# Patient Record
Sex: Female | Born: 1935
Health system: Southern US, Community
[De-identification: ages and names within clinical notes are randomized; demographics above are authoritative.]

## PROBLEM LIST (undated history)

## (undated) DIAGNOSIS — N200 Calculus of kidney: Secondary | ICD-10-CM

## (undated) DIAGNOSIS — K219 Gastro-esophageal reflux disease without esophagitis: Secondary | ICD-10-CM

## (undated) DIAGNOSIS — Z973 Presence of spectacles and contact lenses: Secondary | ICD-10-CM

## (undated) DIAGNOSIS — R21 Rash and other nonspecific skin eruption: Secondary | ICD-10-CM

## (undated) DIAGNOSIS — I10 Essential (primary) hypertension: Secondary | ICD-10-CM

## (undated) DIAGNOSIS — N201 Calculus of ureter: Secondary | ICD-10-CM

## (undated) DIAGNOSIS — M1990 Unspecified osteoarthritis, unspecified site: Secondary | ICD-10-CM

## (undated) DIAGNOSIS — Z86718 Personal history of other venous thrombosis and embolism: Secondary | ICD-10-CM

## (undated) HISTORY — DX: Essential (primary) hypertension: I10

## (undated) HISTORY — PX: EXTRACORPOREAL SHOCK WAVE LITHOTRIPSY: SHX1557

---

## 1968-10-02 HISTORY — PX: TUBAL LIGATION: SHX77

## 1997-03-20 ENCOUNTER — Ambulatory Visit (HOSPITAL_COMMUNITY): Admission: RE | Admit: 1997-03-20 | Discharge: 1997-03-20 | Payer: Self-pay | Admitting: Obstetrics & Gynecology

## 1999-03-11 ENCOUNTER — Other Ambulatory Visit: Admission: RE | Admit: 1999-03-11 | Discharge: 1999-03-11 | Payer: Self-pay | Admitting: Obstetrics and Gynecology

## 2001-04-03 ENCOUNTER — Other Ambulatory Visit: Admission: RE | Admit: 2001-04-03 | Discharge: 2001-04-03 | Payer: Self-pay | Admitting: Obstetrics and Gynecology

## 2002-01-18 ENCOUNTER — Encounter: Payer: Self-pay | Admitting: Orthopedic Surgery

## 2002-01-19 ENCOUNTER — Ambulatory Visit (HOSPITAL_COMMUNITY): Admission: RE | Admit: 2002-01-19 | Discharge: 2002-01-19 | Payer: Self-pay | Admitting: Orthopedic Surgery

## 2002-01-19 HISTORY — PX: OTHER SURGICAL HISTORY: SHX169

## 2002-02-13 ENCOUNTER — Emergency Department (HOSPITAL_COMMUNITY): Admission: EM | Admit: 2002-02-13 | Discharge: 2002-02-13 | Payer: Self-pay | Admitting: Emergency Medicine

## 2002-02-13 ENCOUNTER — Inpatient Hospital Stay (HOSPITAL_COMMUNITY): Admission: AD | Admit: 2002-02-13 | Discharge: 2002-02-19 | Payer: Self-pay | Admitting: *Deleted

## 2002-02-14 ENCOUNTER — Encounter: Payer: Self-pay | Admitting: *Deleted

## 2002-03-21 ENCOUNTER — Encounter: Admission: RE | Admit: 2002-03-21 | Discharge: 2002-05-15 | Payer: Self-pay | Admitting: Orthopedic Surgery

## 2005-04-05 ENCOUNTER — Other Ambulatory Visit: Admission: RE | Admit: 2005-04-05 | Discharge: 2005-04-05 | Payer: Self-pay | Admitting: Obstetrics and Gynecology

## 2006-10-12 ENCOUNTER — Ambulatory Visit (HOSPITAL_COMMUNITY): Admission: RE | Admit: 2006-10-12 | Discharge: 2006-10-12 | Payer: Self-pay | Admitting: Ophthalmology

## 2008-05-16 ENCOUNTER — Other Ambulatory Visit: Admission: RE | Admit: 2008-05-16 | Discharge: 2008-05-16 | Payer: Self-pay | Admitting: Obstetrics and Gynecology

## 2009-05-22 ENCOUNTER — Encounter: Admission: RE | Admit: 2009-05-22 | Discharge: 2009-05-22 | Payer: Self-pay | Admitting: Internal Medicine

## 2009-06-17 ENCOUNTER — Encounter
Admission: RE | Admit: 2009-06-17 | Discharge: 2009-09-09 | Payer: Self-pay | Admitting: Physical Medicine & Rehabilitation

## 2009-06-18 ENCOUNTER — Ambulatory Visit: Payer: Self-pay | Admitting: Physical Medicine & Rehabilitation

## 2009-07-25 ENCOUNTER — Ambulatory Visit: Payer: Self-pay | Admitting: Physical Medicine & Rehabilitation

## 2009-09-09 ENCOUNTER — Encounter
Admission: RE | Admit: 2009-09-09 | Discharge: 2009-09-17 | Payer: Self-pay | Admitting: Physical Medicine & Rehabilitation

## 2009-09-17 ENCOUNTER — Ambulatory Visit: Payer: Self-pay | Admitting: Physical Medicine & Rehabilitation

## 2009-10-24 ENCOUNTER — Encounter
Admission: RE | Admit: 2009-10-24 | Discharge: 2009-10-30 | Payer: Self-pay | Source: Home / Self Care | Attending: Physical Medicine & Rehabilitation | Admitting: Physical Medicine & Rehabilitation

## 2009-10-30 ENCOUNTER — Ambulatory Visit: Payer: Self-pay | Admitting: Physical Medicine & Rehabilitation

## 2009-12-22 ENCOUNTER — Encounter
Admission: RE | Admit: 2009-12-22 | Discharge: 2010-03-03 | Payer: Self-pay | Source: Home / Self Care | Attending: Physical Medicine & Rehabilitation | Admitting: Physical Medicine & Rehabilitation

## 2009-12-30 ENCOUNTER — Ambulatory Visit: Payer: Self-pay | Admitting: Physical Medicine & Rehabilitation

## 2010-03-24 ENCOUNTER — Ambulatory Visit (HOSPITAL_BASED_OUTPATIENT_CLINIC_OR_DEPARTMENT_OTHER): Payer: Medicare Other | Admitting: Physical Medicine & Rehabilitation

## 2010-03-24 ENCOUNTER — Encounter: Payer: Medicare Other | Attending: Physical Medicine & Rehabilitation

## 2010-03-24 DIAGNOSIS — M5380 Other specified dorsopathies, site unspecified: Secondary | ICD-10-CM

## 2010-03-24 DIAGNOSIS — G8929 Other chronic pain: Secondary | ICD-10-CM | POA: Insufficient documentation

## 2010-03-24 DIAGNOSIS — E669 Obesity, unspecified: Secondary | ICD-10-CM | POA: Insufficient documentation

## 2010-03-24 DIAGNOSIS — I10 Essential (primary) hypertension: Secondary | ICD-10-CM

## 2010-03-24 DIAGNOSIS — M47817 Spondylosis without myelopathy or radiculopathy, lumbosacral region: Secondary | ICD-10-CM

## 2010-03-24 DIAGNOSIS — IMO0002 Reserved for concepts with insufficient information to code with codable children: Secondary | ICD-10-CM

## 2010-03-24 DIAGNOSIS — M129 Arthropathy, unspecified: Secondary | ICD-10-CM | POA: Insufficient documentation

## 2010-03-24 DIAGNOSIS — M519 Unspecified thoracic, thoracolumbar and lumbosacral intervertebral disc disorder: Secondary | ICD-10-CM | POA: Insufficient documentation

## 2010-04-28 ENCOUNTER — Other Ambulatory Visit: Payer: Self-pay | Admitting: Obstetrics and Gynecology

## 2010-04-28 ENCOUNTER — Other Ambulatory Visit (HOSPITAL_COMMUNITY)
Admission: RE | Admit: 2010-04-28 | Discharge: 2010-04-28 | Disposition: A | Discharge status: Home / Self Care | Payer: Medicare Other | Source: Ambulatory Visit | Attending: Obstetrics and Gynecology | Admitting: Obstetrics and Gynecology

## 2010-04-28 DIAGNOSIS — Z01419 Encounter for gynecological examination (general) (routine) without abnormal findings: Secondary | ICD-10-CM | POA: Insufficient documentation

## 2010-06-19 NOTE — H&P (Signed)
NAME:  Tasha, Tran NO.:  [id number]   MEDICAL RECORD NO.:  [id number]                   PATIENT TYPE:  INP   LOCATION:  A327                                 FACILITY:  [facility]   PHYSICIAN:  Sarita Bottom, M.D.                  DATE OF BIRTH:  [birth year]   DATE OF ADMISSION:  02/13/2002  DATE OF DISCHARGE:                                HISTORY & PHYSICAL   HISTORY OF PRESENT ILLNESS:  [name] is a 77 year old lady with  a history of hypertension.  She recently had a fracture of the left ankle,  and she is status post open reduction and internal fixation on 01/19/02.  She was apparently well until yesterday when she noticed some tenderness in  her right calf and also a bruise.  She told her primary doctor, who  recommended that she have an ultrasound done of the lower extremities, the  result of which were positive for deep vein thrombosis.  She was therefore  advised to come to the emergency room for admission.   REVIEW OF SYSTEMS:  She denies any fever or weight loss.  Denies any  shortness of breath or cough.  Denies any chest pain or palpitations.  Denies any headache or dizziness.  All other systems were reviewed and were  negative.   PAST MEDICAL HISTORY:  1. She has hypertension.  2. She is status post open reduction and internal fixation of a left ankle     fracture.  3. She had a tubal ligation 30 years ago.   MEDICATIONS:  1. Micardis 40 mg daily.  2. Hydrochlorothiazide 12.5 mg daily.   ALLERGIES:  She has no known drug allergies.   FAMILY HISTORY:  Noncontributory.   SOCIAL HISTORY:  She is married with four children.  She does not smoke.  She drinks alcohol occasionally.   PHYSICAL EXAMINATION:  VITAL SIGNS:  Blood pressure is 160/110, heart rate  90, temperature 99.0.  GENERAL:  She is an elderly, pleasant-looking lady.  She is not in any  apparent distress.  HEENT:  She is not pale.  She is anicteric.  Pupils  equal, round and  reactive to light and accommodation.  Oral mucosa is moist.  NECK:  Supple.  There is no thyromegaly.  There is no lymphadenopathy.  CHEST:  Clear to auscultation.  CARDIOVASCULAR:  Heart sounds 1 and 2 are normal.  Rhythm is regular.  Rate  is normal.  No murmur is appreciated.  ABDOMEN:  Soft.  Bowel sounds are present.  No masses or organomegaly were  felt.  CNS:  She is alert and oriented x3.  She has no gross or focal deficits.  EXTREMITIES:  Her left leg is in a cast.  On her right leg she has some  slight tenderness in the right calf.  She has a palpable purpura on the  calf.  LABORATORY DATA:  Positive D-dimer of 1.8.  Her ABG on room air shows a pH  of 7.42, PCO2 36.7, PO2 91.9, bicarb 23.5, oxygen saturation of 97.5.  PTT  is 29, INR of 1.0.  Sodium is 142, potassium is 3.2, chloride 106, CO2 27,  BUN 16, creatinine 0.8, glucose 109.  Calcium is 9.9.  WBCs 6.8, hemoglobin  12.0, MCV 93.9, platelet 199.   ASSESSMENT AND PLAN:  1. Deep vein thrombosis of the lower extremities.  The patient will be     started on Lovenox 1 mg/kg.  We started her on Coumadin 5 mg tomorrow,     and given her Vicodin p.r.n. for pain.  Will try to get a Doppler     ultrasound report from the primarily M.D.  2. Hypertension.  The patient will be resumed on her Micardis and     hydrochlorothiazide at regular doses.  3. Left ankle fracture.  Will do an x-ray of the left ankle joint, and she     may need orthopedic consult evaluation for possible removal of the cast.  4. Slight hyperkalemia.  This will be corrected with p.o. K-Dur.  We will     monitor her BMET and follow her potassium level.   Further workup will depend on patient's clinical course.                                               Sarita Bottom, M.D.    DW/MEDQ  D:  02/13/2002  T:  02/13/2002  Job:  [id number]

## 2010-06-19 NOTE — Discharge Summary (Signed)
NAMESHARRONDA, SCHWEERS                         ACCOUNT NO.:  [id number]   MEDICAL RECORD NO.:  [id number]                   PATIENT TYPE:  INP   LOCATION:  A327                                 FACILITY:  [facility]   PHYSICIAN:  Sarita Bottom, M.D.                  DATE OF BIRTH:  [birth year]   DATE OF ADMISSION:  02/13/2002  DATE OF DISCHARGE:  02/19/2002                                 DISCHARGE SUMMARY   DISCHARGE DIAGNOSES:  1. Deep venous thrombosis of the lower extremities.  2. Hypertension.  3. Left ankle fracture status post open reduction internal fixation.   DISCHARGE MEDICATIONS:  1. Coumadin 5 mg p.o. daily.  2. Hydrochlorothiazide 25 daily.  3. Norvasc 5 mg daily.  4. Avapro 150 mg daily.  5. Ibuprofen 400 mg t.i.d. p.r.n.   DISPOSITION:  The patient to be discharged home.   FOLLOW UP:  Follow up with primary M.D.  Monitor patient's PT and INR and  also orthopedic follow-up for fracture of the left ankle joint.   HISTORY OF PRESENT ILLNESS:  The patient is a 77 year old lady with a  history of hypertension, history of recent fracture of the left ankle joint  indicated on the 13th of this month when she presented with a complaint of  DVT of both lower extremities.  The patient had seen primary M.D. earlier on  the day of admission with a complaint of tenderness in the legs.  An  ultrasound that was done was suggestive of a DVT of the lower extremities  bilaterally.   PHYSICAL EXAMINATION:  VITAL SIGNS:  Blood pressure 160/110.  CHEST:  Clear to auscultation.  EXTREMITIES:  Her left leg was in a cast.  Right leg:  There was tenderness  in the calf region.   ADMISSION LABORATORIES:  __________ , blood gas on room air showed a pCO2 of  56.7, pO2 of 91.9.  WBC was 6.8, hemoglobin of 12.   HOSPITAL COURSE:  She was admitted with an impression of bilateral DVT,  hypertension, and left ankle fracture.  She was started on Lovenox 1 mg/kg  and Coumadin was started the day  after.  For hypertension she was resumed on  her antihypertensive medication.  For the left ankle joint repeat x-ray was  done.  Her clinical course was essentially unremarkable.  The patient, as  mentioned on rounds today, she has no new complaints.  Denies any shortness  of breath.  Denies any chest pain.   PHYSICAL EXAMINATION:  CHEST:  Clear to auscultation.  HEART:  Sounds 1 and 2 are normal.  Rhythm is regular.  Rate is normal.  ABDOMEN:  Benign.  CNS:  She is alert and oriented x2.  She has no gross or focal deficits.   LABORATORIES:  Her latest laboratories shows hemoglobin of 13, hematocrit of  37.8.  Her INR is 2.0.   DISPOSITION:  Discharged home.  Follow up with primary M.D.  Follow-ups  include to check PT and INR and orthopedic followup.   CONDITION ON DISCHARGE:  Stable and satisfactory.                                               Sarita Bottom, M.D.    DW/MEDQ  D:  02/19/2002  T:  02/19/2002  Job:  [id number]

## 2010-06-19 NOTE — Op Note (Signed)
   NAME:  Tasha Tran, Tasha Tran NO.:  [id number]   MEDICAL RECORD NO.:  [id number]                   PATIENT TYPE:  OIB   LOCATION:  2899                                 FACILITY:  [facility]   PHYSICIAN:  Thera Flake., M.D.             DATE OF BIRTH:  [birth year]   DATE OF PROCEDURE:  01/19/2002  DATE OF DISCHARGE:                                 OPERATIVE REPORT   PREOPERATIVE DIAGNOSIS:  Displaced lateral malleolus fracture with  disruption of the medial deltoid and syndesmosis.   POSTOPERATIVE DIAGNOSIS:  Displaced lateral malleolus fracture with  disruption of the medial deltoid and syndesmosis.   PROCEDURES:  1. Open reduction and internal fixation of lateral malleolus with a seven-     hole semitubular plate.  2. Open reduction and internal fixation of syndesmosis (one 4.5 mm screw).   SURGEON:  Dyke Brackett, M.D.   ANESTHESIA:  General.   TOURNIQUET TIME:  44 minutes.   DESCRIPTION OF PROCEDURE:  Sterile prep and drape.  Exsanguination,  inflation of the tourniquet to 375.  Straight skin incision made on the  lateral side of the ankle.  Lateral malleolus exposed.  An oblique fracture  of the fibula identified about 3-4 cm above the mortise joint.  The anterior  tib-fib ligament was avulsed off the fibula, was reapproximated, followed by  provisional fixation with a seven-hole semitubular plate bent for the  contour of the normal ankle.  It was placed on the posterior lateral aspect  of the fibula, anatomic reduction obtained.  All screws were used, including  the middle screw for a 4.5 mm syndesmotic screw, which was inserted with the  4.5 mm screw.  Anatomic reduction was confirmed in the AP, lateral, and  mortise views.  Copious irrigation carried out.  The medial joint space was  well-reduced.  Closure was effected with 0, 2-0, and 3-0 Vicryl and skin  clips.  Marcaine without epinephrine infiltrated in the skin and the joint,  a total of  20 cc 0.5%.  A lightly compressive sterile dressing applied with  a plaster splint with the foot in neutral.  Taken to the recovery room in  stable condition.                                               Thera Flake., M.D.    WDC/MEDQ  D:  01/19/2002  T:  01/20/2002  Job:  [id number]

## 2010-08-18 ENCOUNTER — Ambulatory Visit: Payer: Medicare Other | Admitting: Physical Medicine & Rehabilitation

## 2010-08-25 ENCOUNTER — Encounter: Payer: Medicare Other | Attending: Physical Medicine & Rehabilitation | Admitting: Physical Medicine & Rehabilitation

## 2010-08-25 DIAGNOSIS — IMO0002 Reserved for concepts with insufficient information to code with codable children: Secondary | ICD-10-CM

## 2010-08-25 DIAGNOSIS — E669 Obesity, unspecified: Secondary | ICD-10-CM | POA: Insufficient documentation

## 2010-08-25 DIAGNOSIS — M519 Unspecified thoracic, thoracolumbar and lumbosacral intervertebral disc disorder: Secondary | ICD-10-CM | POA: Insufficient documentation

## 2010-08-25 DIAGNOSIS — M47817 Spondylosis without myelopathy or radiculopathy, lumbosacral region: Secondary | ICD-10-CM

## 2010-08-25 DIAGNOSIS — I10 Essential (primary) hypertension: Secondary | ICD-10-CM | POA: Insufficient documentation

## 2010-08-25 DIAGNOSIS — M129 Arthropathy, unspecified: Secondary | ICD-10-CM | POA: Insufficient documentation

## 2010-08-25 DIAGNOSIS — M545 Low back pain, unspecified: Secondary | ICD-10-CM | POA: Insufficient documentation

## 2010-08-25 DIAGNOSIS — M5380 Other specified dorsopathies, site unspecified: Secondary | ICD-10-CM

## 2010-08-25 NOTE — Assessment & Plan Note (Signed)
Tasha Tran is back regarding her back pain.  For the most part, she is generally doing well.  She reports some pain in the low back when she is bending to do some tasks around the house.  The back tends to bother at days and during night.  Her leg pain has gone.  Her pain is around 4- 6/10.  She uses usually 1 or 2 ibuprofen in the morning and perhaps 1 or 2 naproxen weekly pain control.  She is doing water aerobics 3 times a week and rather enjoys this.  Pain seems to be worse at night and when she wakes up in the morning.  REVIEW OF SYSTEMS:  Notable for occasional constipation or urinary retention.  Full 12-point review is in the written health and history section of the chart.  SOCIAL HISTORY:  Unchanged.  PHYSICAL EXAMINATION:  VITAL SIGNS:  Blood pressure is 108/70, pulse 71, respiratory rate 18 and she is satting 94% on room air. GENERAL:  The patient is pleasant and alert.  She has general pain in the low lumbar spine and PSIS area.  Strength is stable.  She remains overweight. NEUROLOGIC:  Cognitively, she is alert and appropriate. HEART:  Regular. CHEST:  Clear. ABDOMEN:  Soft and nontender.  ASSESSMENT: 1. Chronic low back pain related to lumbar disk disease and facet     arthropathy.  Radiculopathy symptoms have resolved. 2. Obesity. 3. Hypertension and aerobic deconditioning.  PLAN: 1. Recommended increasing aerobic stamina either through biking and or     walking.  Stationary bike would be good for her to work on posture     and positioning. 2. We also discussed Pilates exercises.  I did give her a packet     today.  We do have a Pilates treatment plan through [facility]     outpatient that could be of benefit for her.  She will call me back     if she would like to pursue this. 3. Continue her anti-inflammatories per her schedule.  I see no need     to make a change here today. 4. I will follow up with her on as needed basis.     Ranelle Oyster,  M.D. Electronically Signed    ZTS/MedQ D:  08/25/2010 10:34:31  T:  08/25/2010 12:09:57  Job #:  [id number]  cc:   Hal T. Stoneking, M.D. Fax: [phone]

## 2011-03-03 ENCOUNTER — Encounter: Payer: Medicare Other | Attending: Neurosurgery | Admitting: Neurosurgery

## 2011-03-03 DIAGNOSIS — G894 Chronic pain syndrome: Secondary | ICD-10-CM

## 2011-03-03 DIAGNOSIS — M545 Low back pain, unspecified: Secondary | ICD-10-CM | POA: Insufficient documentation

## 2011-03-03 DIAGNOSIS — M5430 Sciatica, unspecified side: Secondary | ICD-10-CM | POA: Insufficient documentation

## 2011-03-03 NOTE — Assessment & Plan Note (Signed)
This is a patient of Dr. Rosalyn Charters seen on a p.r.n. basis for low back pain.  She states she had been doing better since she got an epidural steroid injection about a year ago, but it was at some kind of a treat a week or two ago and felt some pain in her low back which has worsened and it has run down the posterolateral aspect of her right leg.  She rates her pain at a 10.  It is a sharp, dull, aching pain.  General activity level is 0-9.  Pain is same 24 hours a day.  Sleep patterns are poor.  Walking, sitting, and standing aggravate.  Heat and medication help.  She uses a walker to ambulate.  She does not climb steps or drive.  She is retired.  REVIEW OF SYSTEMS:  Notable for difficulties as described above as well as some urinary retention.  PAST MEDICAL HISTORY:  Unchanged.  SOCIAL HISTORY:  Unchanged.  FAMILY HISTORY:  Unchanged.  PHYSICAL EXAMINATION:  Blood pressure is 102/58, pulse 87, respirations 16, O2 sats 96 on room air.  Her motor strength and sensation intact, although she gives way to pain on the right lower extremity. Constitutionally, she is obese.  She is alert and oriented x3.  She has a limp to her gait.  ASSESSMENT: 1. Questionable herniated nucleus pulposus. 2. Chronic low back pain and sciatica.  PLAN:  Start her on Sterapred 10 mg 6-day Dosepak, take as directed and we are going to obtain an MRI without gadolinium of the lumbar spine and bring her back to see Dr. Riley Kill after that to make treatment and recommendations.  She is in agreement with this.  Her questions were encouraged and answered.     Milford Cilento L. Blima Dessert Electronically Signed    RLW/MedQ D:  03/03/2011 11:08:08  T:  03/03/2011 13:09:27  Job #:  [id number]

## 2011-03-10 ENCOUNTER — Other Ambulatory Visit: Payer: Self-pay | Admitting: Physical Medicine & Rehabilitation

## 2011-03-10 DIAGNOSIS — M545 Low back pain: Secondary | ICD-10-CM

## 2011-03-17 ENCOUNTER — Ambulatory Visit
Admission: RE | Admit: 2011-03-17 | Discharge: 2011-03-17 | Disposition: A | Discharge status: Home / Self Care | Payer: Medicare Other | Source: Ambulatory Visit | Attending: Physical Medicine & Rehabilitation | Admitting: Physical Medicine & Rehabilitation

## 2011-03-17 DIAGNOSIS — M545 Low back pain: Secondary | ICD-10-CM

## 2011-04-01 ENCOUNTER — Ambulatory Visit (HOSPITAL_BASED_OUTPATIENT_CLINIC_OR_DEPARTMENT_OTHER): Payer: Medicare Other | Admitting: Physical Medicine & Rehabilitation

## 2011-04-01 ENCOUNTER — Encounter: Payer: Self-pay | Admitting: Physical Medicine & Rehabilitation

## 2011-04-01 ENCOUNTER — Encounter: Payer: Medicare Other | Attending: Neurosurgery

## 2011-04-01 DIAGNOSIS — IMO0002 Reserved for concepts with insufficient information to code with codable children: Secondary | ICD-10-CM

## 2011-04-01 DIAGNOSIS — M5430 Sciatica, unspecified side: Secondary | ICD-10-CM | POA: Insufficient documentation

## 2011-04-01 DIAGNOSIS — M545 Low back pain, unspecified: Secondary | ICD-10-CM | POA: Insufficient documentation

## 2011-04-01 NOTE — Patient Instructions (Signed)
Epidural Steroid Injection  Care After   Refer to this sheet in the next few weeks. These instructions provide you with information on caring for yourself after your procedure. Your caregiver may also give you more specific instructions. Your treatment has been planned according to current medical practices, but problems sometimes occur. Call your caregiver if you have any problems or questions after your procedure.  HOME CARE INSTRUCTIONS   · Avoid the use of heat on the injection site for a day.   · Do not have a tub bath or soak in water for the rest of the day.   · Remove the bandage on the next day.   · Resume your normal activities on the next day.   · Use ice packs or mild pain relievers to reduce the soreness around the injection site.   · Follow up with your caregiver 7 to 10 days after the procedure.   SEEK MEDICAL CARE IF:   · You develop a fever of more than 100.5° F (38.1° C).   · You continue to have pain and soreness over the injection site even after taking medicines.   · You develop significant nausea or vomiting.   SEEK IMMEDIATE MEDICAL CARE IF:   · You have severe back pain, which is not relieved by medicines.   · You develop severe headache, stiff neck, or sensitivity to light.   · You develop any new numbness or weakness of your legs.   · You lose control over your bladder or bowel movements.   · You develop a fever of more than 102° F (38.9° C).   · You develop difficulty breathing.   Document Released: 05/05/2010 Document Revised: 09/30/2010 Document Reviewed: 05/05/2010  [employer]® Patient Information ©2012 [employer].

## 2011-04-01 NOTE — Progress Notes (Signed)
Lumbar transforaminal epidural steroid injection under fluoroscopic guidance right L4-L5 Indication: Lumbosacral radiculitis is not relieved by medication management or other conservative care and interfering with self-care and mobility.   Informed consent was obtained after describing risk and benefits of the procedure with the patient, this includes bleeding, bruising, infection, paralysis and medication side effects.  The patient wishes to proceed and has given written consent.  Patient was placed in prone position.  The lumbar area was marked and prepped with Betadine.  It was entered with a 25-gauge 1-1/2 inch needle and one mL of 1% lidocaine was injected into the skin and subcutaneous tissue.  Then a 22-gauge 5 inch spinal needle was inserted into the L4-L5 right side intervertebral foramen under AP, lateral, and oblique view.  Then a solution containing one mL of 10 mg per mL dexamethasone and 2 mL of 1% lidocaine was injected.  The patient tolerated procedure well.  Post procedure instructions were given.  Please see post procedure form.

## 2011-04-01 NOTE — Progress Notes (Signed)
  PROCEDURE RECORD [facility]   Name: Tasha Tran DOB:09/08/1929 MRN: [id number]  Date:04/01/2011  Physician: Claudette Laws, MD    Nurse/CMA: Noralyn Pick, CMA  Allergies: No Known Allergies  Consent Signed: yes  Is patient diabetic? no   Pregnant: no LMP: No LMP recorded. Patient is not currently having periods (Reason: Other). (age [age])  Anticoagulants: no Anti-inflammatory: yes Antibiotics: no  Procedure: LESI  Position: Prone Start Time: 11:55am  End Time: 12:01pm  Fluoro Time: 13 secs  RN/CMA Noralyn Pick, CMA Carroll, CMA    Time 11:34am 12:05pm    BP 144/69 128/69    Pulse 83 82    Respirations 16 16    O2 Sat 96 96    S/S 6 6    Pain Level 8-9/10 6/10     D/C home with husband, patient A & O X 3, D/C instructions reviewed, and sits independently.

## 2011-04-02 ENCOUNTER — Encounter: Payer: Medicare Other | Admitting: Physical Medicine & Rehabilitation

## 2011-04-05 ENCOUNTER — Ambulatory Visit: Payer: Medicare Other | Admitting: Physical Medicine & Rehabilitation

## 2011-04-14 ENCOUNTER — Encounter: Payer: Self-pay | Admitting: Physical Medicine & Rehabilitation

## 2011-04-14 ENCOUNTER — Encounter: Payer: Medicare Other | Attending: Neurosurgery | Admitting: Physical Medicine & Rehabilitation

## 2011-04-14 VITALS — BP 106/59 | HR 77 | Resp 16 | Ht 60.0 in | Wt 210.0 lb

## 2011-04-14 DIAGNOSIS — M48061 Spinal stenosis, lumbar region without neurogenic claudication: Secondary | ICD-10-CM | POA: Insufficient documentation

## 2011-04-14 DIAGNOSIS — IMO0002 Reserved for concepts with insufficient information to code with codable children: Secondary | ICD-10-CM | POA: Insufficient documentation

## 2011-04-14 MED ORDER — TRAMADOL HCL 50 MG PO TABS: 50.0000 mg | ORAL_TABLET | Freq: Two times a day (BID) | ORAL | Status: AC | PRN

## 2011-04-14 MED ORDER — CARBAMAZEPINE ER 100 MG PO TB12: 100.0000 mg | ORAL_TABLET | Freq: Two times a day (BID) | ORAL | Status: AC

## 2011-04-14 NOTE — Progress Notes (Signed)
Subjective:    Patient ID: Tasha Tran, female    DOB: 1929-07-18, 78 y.o.   MRN: [id number]  HPI  Mrs. Disch is back regarding her back and low back pain. It resurfaced about 7 weeks ago. I reimaged her lumbar spine last month and it revealed:   L4-5: Prominent bilateral facet joint degenerative changes. 7 mm  anterior slip of L4. Moderate bulge/broad-based protrusion  slightly greater to the right. Ligamentum flavum hypertrophy  greater on the left. Dorsal epidural fat. Multifactorial moderate  to marked spinal stenosis. Mild bilateral foraminal narrowing.  L5-S1: Moderate to marked bilateral facet joint degenerative  changes. Bony overgrowth greater on the left. 3.6 mm anterior slip  L5. Bulge. Mild left lateral recess stenosis  The patient had a follow up ESI but a L4-L5 approach was used because of her central stenosis I believe.  The patient states that she didn't have any relief from the injection.  Pain is generally down the lateral leg into the ankle.  The pain is worst at night into the morning. Activity also increases pain.  The neurontin makes her feel loopy and the mobic wasn't helping.   Pain Inventory Average Pain 9 Pain Right Now 9 My pain is sharp, dull and aching  In the last 24 hours, has pain interfered with the following? General activity 6 Relation with others 1 Enjoyment of life 2 What TIME of day is your pain at its worst? night Sleep (in general) Fair  Pain is worse with: It's hard to pin point Pain improves with: medication Relief from Meds: 3  Mobility walk without assistance  Function retired  Neuro/Psych No problems in this area  Prior Studies Any changes since last visit?  no  Physicians involved in your care Any changes since last visit?  no      Review of Systems  Constitutional: Negative.   HENT: Negative.   Eyes: Negative.   Respiratory: Negative.   Cardiovascular: Negative.   Gastrointestinal: Negative.     Genitourinary: Negative.   Musculoskeletal: Negative.   Skin: Negative.   Neurological: Negative.   Hematological: Negative.   Psychiatric/Behavioral: Negative.        Objective:   Physical Exam  Constitutional: She is oriented to person, place, and time. She appears well-developed and well-nourished.       obese  HENT:  Head: Normocephalic.  Eyes: EOM are normal. Pupils are equal, round, and reactive to light.  Neck: Normal range of motion.  Cardiovascular: Normal rate and regular rhythm.   Pulmonary/Chest: Effort normal and breath sounds normal.  Abdominal: Soft. Bowel sounds are normal.  Musculoskeletal: Normal range of motion.       Right low back pain with palpation.  Mild pain with lubmbar flexion.  Can bend to about 80 degrees. SLR and seated slump test is equivocal.  Neurological: She is alert and oriented to person, place, and time. She has normal strength and normal reflexes. A sensory deficit is present.  Skin: Skin is warm.  Psychiatric: She has a normal mood and affect. Her behavior is normal. Judgment and thought content normal.          Assessment & Plan:  ASSESSMENT:  1. Chronic low back pain related to lumbar disk disease and facet  arthropathy. Radicular sx still in the right L5 dermatome  2. Obesity.  3. Hypertension and aerobic deconditioning.  PLAN:  1. Stop neurontin and trial tegretol. She needs to take it consistently however. 2. She may use otc  naproxen 3. Added tramdol for pain relief hs prn 4. Exercise to tolerance 5. Consider f./u ESI.  Need to do translaminar if at all. 6. See me next month

## 2011-04-14 NOTE — Patient Instructions (Signed)
DON'T MIX TRAMADOL OR TEGRETOL WITH ALCOHOL!!!!!!

## 2011-05-10 ENCOUNTER — Encounter: Payer: Self-pay | Admitting: Physical Medicine & Rehabilitation

## 2011-05-11 ENCOUNTER — Ambulatory Visit: Payer: Medicare Other | Admitting: Physical Medicine & Rehabilitation

## 2011-06-18 ENCOUNTER — Encounter: Payer: Self-pay | Admitting: Physical Medicine & Rehabilitation

## 2011-06-18 ENCOUNTER — Encounter: Payer: Medicare Other | Attending: Neurosurgery | Admitting: Physical Medicine & Rehabilitation

## 2011-06-18 VITALS — BP 144/78 | HR 77 | Ht 60.0 in | Wt 218.0 lb

## 2011-06-18 DIAGNOSIS — M48061 Spinal stenosis, lumbar region without neurogenic claudication: Secondary | ICD-10-CM | POA: Insufficient documentation

## 2011-06-18 DIAGNOSIS — IMO0002 Reserved for concepts with insufficient information to code with codable children: Secondary | ICD-10-CM | POA: Insufficient documentation

## 2011-06-18 NOTE — Progress Notes (Signed)
  Subjective:    Patient ID: Tasha Tran, female    DOB: [birth year], 78 y.o.   MRN: [id number]  HPI  Tasha Tran is back regarding her low back pain and radiculopathy. She is feeling better over the last couple months. She attributes it to an over the counter liniment. She is trying to become more active as a whole and is ready to get back into her water aerobics. She feels her increased activity has also helped.  Her pain is most significant in the right lateral leg and bothers her mostly at night when she's lying supine.    Pain Inventory Average Pain 5 Pain Right Now 6 My pain is dull  In the last 24 hours, has pain interfered with the following? General activity 3 Relation with others 0 Enjoyment of life 3 What TIME of day is your pain at its worst? night Sleep (in general) Fair  Pain is worse with: unsure and some activites Pain improves with: therapy/exercise and medication Relief from Meds: 7  Mobility walk without assistance walk with assistance use a walker how many minutes can you walk? 10-20 min ability to climb steps?  yes do you drive?  yes Do you have any goals in this area?  yes  Function retired  Neuro/Psych No problems in this area  Prior Studies Any changes since last visit?  no  Physicians involved in your care Any changes since last visit?  no       Review of Systems  Respiratory: Positive for cough.   Genitourinary: Positive for difficulty urinating.  All other systems reviewed and are negative.       Objective:   Physical Exam  Constitutional: She appears well-developed and well-nourished.  HENT:  Head: Normocephalic and atraumatic.  Neck: Normal range of motion.  Cardiovascular: Normal rate and regular rhythm.   Pulmonary/Chest: Effort normal. No respiratory distress. She has no wheezes.  Abdominal: She exhibits no distension. There is no tenderness.  Musculoskeletal:       Mild lumbar spine tenderness. She  remains morbidly obese. She's able to flex to 65 degrees, but a lot of the limitation is due to her weight.  Extension and facet maneuvers seemed to cause more pain than anything else.   SLR was equivocal right leg.  SST was equivocal also.   Neurological: She has normal strength and normal reflexes. No cranial nerve deficit or sensory deficit.          Assessment & Plan:  ASSESSMENT:  1. Chronic low back pain related to lumbar disk disease and facet  arthropathy. Radicular sx still in the right L5 dermatome but overall have imiproved. 2. Obesity.  3. Hypertension and aerobic deconditioning.    PLAN:  1. May use tylenol or ibuprofen.  2. She may use otc naproxen also.  3. Mad a referral to outpt PT for pilates-based therapy, HEP 4. Exercise to tolerance- she needs to increase her stamina. 5. In reality, if she wants to prevent further increase in her pain, she needs to fully invest in a holistic program which includes increased exercise, weight loss, improved posture, and stretching. 6. I'll see her back prn. All questions were encouraged and answered.

## 2011-06-18 NOTE — Patient Instructions (Signed)
Continue to increase your stretching and exercise program as you tolerate

## 2011-11-30 ENCOUNTER — Other Ambulatory Visit: Payer: Self-pay | Admitting: Dermatology

## 2013-03-06 ENCOUNTER — Other Ambulatory Visit: Payer: Self-pay | Admitting: Dermatology

## 2013-03-22 ENCOUNTER — Other Ambulatory Visit: Payer: Self-pay | Admitting: Dermatology

## 2013-10-29 ENCOUNTER — Emergency Department (INDEPENDENT_AMBULATORY_CARE_PROVIDER_SITE_OTHER)
Admission: EM | Admit: 2013-10-29 | Discharge: 2013-10-29 | Disposition: A | Discharge status: Home / Self Care | Payer: Medicare Other | Source: Home / Self Care | Attending: Emergency Medicine | Admitting: Emergency Medicine

## 2013-10-29 ENCOUNTER — Encounter (HOSPITAL_COMMUNITY): Payer: Self-pay | Admitting: Emergency Medicine

## 2013-10-29 DIAGNOSIS — Y92009 Unspecified place in unspecified non-institutional (private) residence as the place of occurrence of the external cause: Secondary | ICD-10-CM

## 2013-10-29 DIAGNOSIS — T148XXA Other injury of unspecified body region, initial encounter: Secondary | ICD-10-CM

## 2013-10-29 DIAGNOSIS — W5581XA Bitten by other mammals, initial encounter: Secondary | ICD-10-CM

## 2013-10-29 DIAGNOSIS — T148 Other injury of unspecified body region: Secondary | ICD-10-CM

## 2013-10-29 DIAGNOSIS — T1490XA Injury, unspecified, initial encounter: Secondary | ICD-10-CM

## 2013-10-29 MED ORDER — AMOXICILLIN-POT CLAVULANATE 875-125 MG PO TABS: 1.0000 | ORAL_TABLET | Freq: Two times a day (BID) | ORAL | Status: DC

## 2013-10-29 MED ORDER — BACITRACIN ZINC 500 UNIT/GM EX OINT
TOPICAL_OINTMENT | CUTANEOUS | Status: AC
  Filled 2013-10-29: qty 4.5

## 2013-10-29 NOTE — ED Provider Notes (Signed)
Chief Complaint   Animal Bite   History of Present Illness   Tasha Tran is an 80 year old female who was bitten on the left index, middle, and ring fingers this afternoon by a squirrel that her dog had dragged into the house. She has several small puncture wounds on these 3 fingers. There is no swelling or erythema. She's able to move all of her joints well. She denies any numbness or tingling. Her last tetanus shot was February 2007.  Review of Systems   Other than as noted above, the patient denies any of the following symptoms: Systemic:  No fevers or chills. Musculoskeletal:  No joint pain or arthritis.  Neurological:  No muscular weakness or paresthesias.  [place]   Past medical history, family history, social history, meds, and allergies were reviewed.   She has hypertension and back problems. Current meds include amlodipine, aspirin, HydroDIURIL, Lidoderm, metoprolol, Ocuvite, Deltasone, and Altace. She has no medication allergies.  Physical Examination   Vital signs:  BP 118/82  Pulse 95  Temp(Src) 99.2 F (37.3 C) (Oral)  Resp 18  SpO2 95% Gen:  Alert and in no distress. Musculoskeletal:  Exam of the hand reveals she has several small puncture wounds on her left hand, on the index, middle, and ring fingers. These were not bleeding, no surrounding erythema, full range of motion of all joints, and no joint swelling or pain to palpation.  Otherwise, all joints had a full a ROM with no swelling, bruising or deformity.  No edema, pulses full. Extremities were warm and pink.  Capillary refill was brisk.  Skin:  Clear, warm and dry.  No rash. Neuro:  Alert and oriented.  Muscle strength was normal.  Sensation was intact to light touch.     Course in [facility] [place]   The wounds were cleansed and antibiotic ointment was applied and Band-Aid dressings.  Assessment   The primary encounter diagnosis was Animal bite. Diagnoses of Accidental injury and Place of  occurrence, home were also pertinent to this visit.  No risk of rabies since this was a squirrel bite. I am most concerned about infection in the joints since the bites are in near the joints, particularly over the DIP joint of the index finger and the PIP joint for the middle finger, although there is no evidence of infection right now.I have asked her to keep a close watch on these and given strict return precautions either here or with her primary care physician if she should develop any increasing pain, swelling, or difficulty with movement of her joints.   Plan  1.  Meds:  The following meds were prescribed:   Discharge Medication List as of 10/29/2013 12:50 PM    START taking these medications   Details  amoxicillin-clavulanate (AUGMENTIN) 875-125 MG per tablet Take 1 tablet by mouth 2 (two) times daily., Starting 10/29/2013, Until Discontinued, Normal        2.  Patient Education/Counseling:  The patient was given appropriate handouts, self care instructions, and instructed in symptomatic relief, including rest and activity, and elevation. The patient was instructed in wound care.    3.  Follow up:  The patient was told to follow up here if no better in 3 to 4 days, or sooner if becoming worse in any way, and given some red flag symptoms such as worsening pain, fever, swelling, or neurological symptoms which would prompt immediate return.        Harden Mo, MD 10/29/13 [phone]

## 2013-10-29 NOTE — ED Notes (Signed)
Concern about squirrel bite of finger earlier today

## 2013-10-29 NOTE — Discharge Instructions (Signed)

## 2014-04-24 ENCOUNTER — Other Ambulatory Visit: Payer: Self-pay

## 2014-04-24 ENCOUNTER — Other Ambulatory Visit: Payer: Self-pay | Admitting: Obstetrics and Gynecology

## 2014-05-07 ENCOUNTER — Other Ambulatory Visit: Payer: Self-pay | Admitting: Dermatology

## 2014-05-25 ENCOUNTER — Emergency Department (HOSPITAL_COMMUNITY)
Admission: EM | Admit: 2014-05-25 | Discharge: 2014-05-25 | Disposition: A | Discharge status: Home / Self Care | Payer: Medicare Other | Attending: Emergency Medicine | Admitting: Emergency Medicine

## 2014-05-25 ENCOUNTER — Encounter (HOSPITAL_COMMUNITY): Payer: Self-pay

## 2014-05-25 ENCOUNTER — Emergency Department (HOSPITAL_COMMUNITY): Payer: Medicare Other

## 2014-05-25 DIAGNOSIS — Z792 Long term (current) use of antibiotics: Secondary | ICD-10-CM | POA: Insufficient documentation

## 2014-05-25 DIAGNOSIS — Z7952 Long term (current) use of systemic steroids: Secondary | ICD-10-CM | POA: Insufficient documentation

## 2014-05-25 DIAGNOSIS — R630 Anorexia: Secondary | ICD-10-CM | POA: Diagnosis not present

## 2014-05-25 DIAGNOSIS — Z8589 Personal history of malignant neoplasm of other organs and systems: Secondary | ICD-10-CM | POA: Diagnosis not present

## 2014-05-25 DIAGNOSIS — Z8719 Personal history of other diseases of the digestive system: Secondary | ICD-10-CM | POA: Diagnosis not present

## 2014-05-25 DIAGNOSIS — M7981 Nontraumatic hematoma of soft tissue: Secondary | ICD-10-CM | POA: Diagnosis not present

## 2014-05-25 DIAGNOSIS — R109 Unspecified abdominal pain: Secondary | ICD-10-CM | POA: Diagnosis present

## 2014-05-25 DIAGNOSIS — I10 Essential (primary) hypertension: Secondary | ICD-10-CM | POA: Diagnosis not present

## 2014-05-25 DIAGNOSIS — N200 Calculus of kidney: Secondary | ICD-10-CM | POA: Insufficient documentation

## 2014-05-25 DIAGNOSIS — Z79899 Other long term (current) drug therapy: Secondary | ICD-10-CM | POA: Insufficient documentation

## 2014-05-25 DIAGNOSIS — Z7982 Long term (current) use of aspirin: Secondary | ICD-10-CM | POA: Insufficient documentation

## 2014-05-25 LAB — CBC WITH DIFFERENTIAL/PLATELET
BASOS ABS: 0 10*3/uL (ref 0.0–0.1)
Basophils Relative: 0 % (ref 0–1)
EOS ABS: 0.1 10*3/uL (ref 0.0–0.7)
EOS PCT: 1 % (ref 0–5)
HEMATOCRIT: 38.5 % (ref 36.0–46.0)
HEMOGLOBIN: 12.9 g/dL (ref 12.0–15.0)
Lymphocytes Relative: 18 % (ref 12–46)
Lymphs Abs: 1.5 10*3/uL (ref 0.7–4.0)
MCH: 33 pg (ref 26.0–34.0)
MCHC: 33.5 g/dL (ref 30.0–36.0)
MCV: 98.5 fL (ref 78.0–100.0)
Monocytes Absolute: 0.9 10*3/uL (ref 0.1–1.0)
Monocytes Relative: 11 % (ref 3–12)
Neutro Abs: 5.7 10*3/uL (ref 1.7–7.7)
Neutrophils Relative %: 70 % (ref 43–77)
PLATELETS: 162 10*3/uL (ref 150–400)
RBC: 3.91 MIL/uL (ref 3.87–5.11)
RDW: 13.2 % (ref 11.5–15.5)
WBC: 8.2 10*3/uL (ref 4.0–10.5)

## 2014-05-25 LAB — COMPREHENSIVE METABOLIC PANEL
ALT: 16 U/L (ref 0–35)
AST: 23 U/L (ref 0–37)
Albumin: 3.5 g/dL (ref 3.5–5.2)
Alkaline Phosphatase: 57 U/L (ref 39–117)
Anion gap: 11 (ref 5–15)
BUN: 20 mg/dL (ref 6–23)
CHLORIDE: 99 mmol/L (ref 96–112)
CO2: 27 mmol/L (ref 19–32)
Calcium: 9.6 mg/dL (ref 8.4–10.5)
Creatinine, Ser: 1.63 mg/dL — ABNORMAL HIGH (ref 0.50–1.10)
GFR calc Af Amer: 33 mL/min — ABNORMAL LOW (ref >90)
GFR calc non Af Amer: 28 mL/min — ABNORMAL LOW (ref >90)
Glucose, Bld: 116 mg/dL — ABNORMAL HIGH (ref 70–99)
Potassium: 3.5 mmol/L (ref 3.5–5.1)
Sodium: 137 mmol/L (ref 135–145)
Total Bilirubin: 0.7 mg/dL (ref 0.3–1.2)
Total Protein: 7 g/dL (ref 6.0–8.3)

## 2014-05-25 LAB — URINALYSIS, ROUTINE W REFLEX MICROSCOPIC
BILIRUBIN URINE: NEGATIVE
Glucose, UA: NEGATIVE mg/dL
KETONES UR: 15 mg/dL — AB
Nitrite: NEGATIVE
Protein, ur: NEGATIVE mg/dL
SPECIFIC GRAVITY, URINE: 1.013 (ref 1.005–1.030)
UROBILINOGEN UA: 0.2 mg/dL (ref 0.0–1.0)
pH: 7.5 (ref 5.0–8.0)

## 2014-05-25 LAB — URINE MICROSCOPIC-ADD ON

## 2014-05-25 LAB — LIPASE, BLOOD: LIPASE: 27 U/L (ref 11–59)

## 2014-05-25 MED ORDER — SODIUM CHLORIDE 0.9 % IV BOLUS (SEPSIS): 1000.0000 mL | Freq: Once | INTRAVENOUS | Status: DC

## 2014-05-25 MED ORDER — ONDANSETRON HCL 4 MG/2ML IJ SOLN
4.0000 mg | Freq: Once | INTRAMUSCULAR | Status: AC
  Administered 2014-05-25: 4 mg via INTRAVENOUS
  Filled 2014-05-25: qty 2

## 2014-05-25 MED ORDER — HYDROCODONE-ACETAMINOPHEN 5-325 MG PO TABS: 1.0000 | ORAL_TABLET | Freq: Four times a day (QID) | ORAL | Status: DC | PRN

## 2014-05-25 MED ORDER — TAMSULOSIN HCL 0.4 MG PO CAPS: 0.4000 mg | ORAL_CAPSULE | Freq: Every day | ORAL | Status: DC

## 2014-05-25 MED ORDER — MORPHINE SULFATE 4 MG/ML IJ SOLN
4.0000 mg | Freq: Once | INTRAMUSCULAR | Status: AC
  Administered 2014-05-25: 4 mg via INTRAVENOUS
  Filled 2014-05-25: qty 1

## 2014-05-25 NOTE — ED Notes (Signed)
Has had problems with bowels and has worked that out. Has been having some left flank pain since. Denies any pain with urination. Has had some nausea and vomiting during the intestinal issues.

## 2014-05-25 NOTE — ED Notes (Signed)
Patient given hat to catch urine in, and strainer. Teaching for home use. Preparing for discharge.

## 2014-05-25 NOTE — ED Provider Notes (Signed)
CSN: [id number]     Arrival date & time 05/25/14  1524 History   First MD Initiated Contact with Patient 05/25/14 1546     Chief Complaint  Patient presents with  . Flank Pain     (Consider location/radiation/quality/duration/timing/severity/associated sxs/prior Treatment) HPI Comments: Patient states approximately one week ago she started to notice pain in her left flank and back area started spontaneously. Also around that time she started having issues with constipation. She took medications which have relieved her constipation however the pain in her back continues. She denies trauma, recent surgery or prior issues of this nature. She does have a lipoma in that area but states that has been there for years and never given her any problems. She denies any recent medication changes.  No fever, bloody stools, hematuria. No dysuria but notices that she can only urinate small amounts at a time. She has a hard time knowing if Tylenol and NSAIDs have helped but they do not take the pain away. No trauma prior to the pain starting.  Patient is a 81 y.o. female presenting with flank pain. The history is provided by the patient.  Flank Pain This is a new problem. Episode onset: 1 week. The problem occurs constantly. The problem has not changed since onset.Associated symptoms include abdominal pain. Pertinent negatives include no chest pain and no shortness of breath. Associated symptoms comments: No fever.  Initially earlier this week she was having constipation and took mag citrate and stool softeners which improved her constipation and she continues to have water loose stools but pain in the left flank area has not improved.  One episode of vomiting after taking the stomach meds but no more vomiting.  Intermittent nausea and for the last 2 days no appetite and has not eaten.  Has noticed decreased urine output.. The symptoms are relieved by acetaminophen and NSAIDs (sitting still and not moving much.).  The treatment provided no relief.    Past Medical History  Diagnosis Date  . Hypertension    Past Surgical History  Procedure Laterality Date  . Ankle surgery  2003    ANKLE BREAK   No family history on file. History  Substance Use Topics  . Smoking status: Never Smoker   . Smokeless tobacco: Not on file  . Alcohol Use: Yes     Comment: socially   OB History    No data available     Review of Systems  Respiratory: Negative for shortness of breath.   Cardiovascular: Negative for chest pain.  Gastrointestinal: Positive for abdominal pain.  Genitourinary: Positive for flank pain.  All other systems reviewed and are negative.     Allergies  Review of patient's allergies indicates no known allergies.  Home Medications   Prior to Admission medications   Medication Sig Start Date End Date Taking? Authorizing Provider  amLODipine (NORVASC) 10 MG tablet Take 10 mg by mouth daily.    Historical Provider, MD  amoxicillin-clavulanate (AUGMENTIN) 875-125 MG per tablet Take 1 tablet by mouth 2 (two) times daily. 10/29/13   Harden Mo, MD  aspirin 650 MG EC tablet Take 650 mg by mouth daily.    Historical Provider, MD  GARLIC PO Take by mouth.    Historical Provider, MD  Glucosamine-Chondroitin (GLUCOSAMINE CHONDR COMPLEX PO) Take by mouth.    Historical Provider, MD  hydrochlorothiazide (HYDRODIURIL) 25 MG tablet Take 25 mg by mouth daily.    Historical Provider, MD  lidocaine (LIDODERM) 5 % Place 1 patch  onto the skin daily. Remove & Discard patch within 12 hours or as directed by MD 12 HOURS ON - 12 HOURS OFF    Historical Provider, MD  LUTEIN PO Take by mouth.    Historical Provider, MD  metoprolol succinate (TOPROL-XL) 50 MG 24 hr tablet Take 50 mg by mouth daily. Take with or immediately following a meal.    Historical Provider, MD  Multiple Vitamin (MULTI-VITAMIN DAILY PO) Take by mouth.    Historical Provider, MD  multivitamin-lutein [facility]) CAPS Take 1  capsule by mouth daily.    Historical Provider, MD  Omega-3 Fatty Acids (OMEGA 3 PO) Take by mouth.    Historical Provider, MD  predniSONE (DELTASONE) 20 MG tablet Take 20 mg by mouth daily. TAPER DOSE    Historical Provider, MD  ramipril (ALTACE) 10 MG tablet Take 10 mg by mouth daily.    Historical Provider, MD  vitamin C (ASCORBIC ACID) 500 MG tablet Take 500 mg by mouth daily.    Historical Provider, MD   BP 139/93 mmHg  Pulse 88  Temp(Src) 98.2 F (36.8 C)  Resp 16  Ht 5\' 1"  (1.549 m)  Wt 200 lb (90.719 kg)  BMI 37.81 kg/m2  SpO2 100% Physical Exam  Constitutional: She is oriented to person, place, and time. She appears well-developed and well-nourished. No distress.  HENT:  Head: Normocephalic and atraumatic.  Mouth/Throat: Oropharynx is clear and moist.  Eyes: Conjunctivae and EOM are normal. Pupils are equal, round, and reactive to light.  Neck: Normal range of motion. Neck supple.  Cardiovascular: Normal rate, regular rhythm and intact distal pulses.   No murmur heard. Pulmonary/Chest: Effort normal and breath sounds normal. No respiratory distress. She has no wheezes. She has no rales.  Abdominal: Soft. She exhibits no distension. There is tenderness in the right lower quadrant and left upper quadrant. There is CVA tenderness. There is no rebound and no guarding.    Musculoskeletal: Normal range of motion. She exhibits no edema or tenderness.       Back:  Neurological: She is alert and oriented to person, place, and time.  Skin: Skin is warm and dry. No rash noted. No erythema.  Psychiatric: She has a normal mood and affect. Her behavior is normal.  Nursing note and vitals reviewed.   ED Course  Procedures (including critical care time) Labs Review Labs Reviewed  COMPREHENSIVE METABOLIC PANEL - Abnormal; Notable for the following:    Glucose, Bld 116 (*)    Creatinine, Ser 1.63 (*)    GFR calc non Af Amer 28 (*)    GFR calc Af Amer 33 (*)    All other  components within normal limits  URINALYSIS, ROUTINE W REFLEX MICROSCOPIC - Abnormal; Notable for the following:    APPearance TURBID (*)    Hgb urine dipstick TRACE (*)    Ketones, ur 15 (*)    Leukocytes, UA SMALL (*)    All other components within normal limits  CBC WITH DIFFERENTIAL/PLATELET  LIPASE, BLOOD  URINE MICROSCOPIC-ADD ON    Imaging Review Ct Abdomen Pelvis Wo Contrast  05/25/2014   CLINICAL DATA:  Left flank pain for 2 weeks episodically with hematuria  EXAM: CT ABDOMEN AND PELVIS WITHOUT CONTRAST  TECHNIQUE: Multidetector CT imaging of the abdomen and pelvis was performed following the standard protocol without IV contrast.  COMPARISON:  No similar prior exam is available at this institution for comparison or on [facility].  FINDINGS: Lower chest:  Lung bases are  clear.  Hepatobiliary: Gallstones noted without CT evidence for acute cholecystitis. Liver is unremarkable allowing for lack of contrast.  Pancreas: Normal  Spleen: Normal  Adrenals/Urinary Tract: Moderate left hydroureteronephrosis is identified to the level of a 8 mm proximal left ureteral calculus image 36. Moderate left perinephric fluid may indicate forniceal rupture. Nonobstructing 2 mm left upper renal pole calculus image 27. Renal cortical lobulation is identified bilaterally with low-density lesions which could represent cysts but are not further evaluated. No right hydroureteronephrosis. No right ureteral calculus.  Stomach/Bowel: Stomach is normal. No bowel wall thickening or focal segmental dilatation is identified. Normal appendix.  Vascular/Lymphatic: Moderate atheromatous aortic calcification without aneurysm. No lymphadenopathy.  Reproductive: 1.9 cm hypodense mass within the right lateral uterine body could represent a lipoleiomyoma. Ovaries appear normal.  Other: No free air or fluid.  Musculoskeletal: Multilevel disc degenerative change noted in the thoracolumbar spine. No acute osseous abnormality.   IMPRESSION: 8 mm proximal left ureteral calculus producing moderate proximal left hydroureteronephrosis. Moderate left perinephric fluid may indicate forniceal rupture.   Electronically Signed   By: Conchita Paris M.D.   On: 05/25/2014 19:30     EKG Interpretation None      MDM   Final diagnoses:  Left flank pain  Kidney stone    Patient presenting with a one-week history of left flank/back pain. Initially stating that she was having trouble with constipation but after taking magnesium citrate and stool softeners she has had persistent runny stools but no improvement in the pain. Also she has noted decreased urination and only able to urinate very small amounts at a time. She denies any dysuria, fever but has had some nausea and one episode of vomiting however the vomiting occurred after taking the magnesium citrate. She denies ever having pain like this in the past and no history of kidney problems or kidney stones. No prior abdominal surgeries other than a tubal ligation.  No vaginal bleeding. She has difficult time finding things that make the pain worse but sitting still and not moving seems to make it slightly better.  On exam patient has left upper quadrant, right lower quadrant abdominal pain as well as significant tenderness with palpation in the left flank area under a lipoma that is been present for years.  Concern for diverticulitis, pancreatitis, kidney stone, UTI, musculoskeletal cause for her pain. She has been taking ibuprofen, Tylenol, naproxen and does not know if it helps much.  CBC, CMP, lipase, UA pending  7:57 PM CBC within normal limits. CMP with mild renal insufficiency with creatinine of 1.6 without prior to compare. Lipase within normal limits and UA with a white blood cell count of 2-10 but otherwise trace hemoglobin and normal. CT shows an 8 mm left proximal ureteral stone with perinephric fluid. Discussed with Dr. Wynetta Emery with urology and he recommended Flomax,  pain control in calling the office on Monday for further management. Findings discussed with patient and her husband they're agreeable to plan and she'll be discharged home with Flomax and Vicodin.  Blanchie Dessert, MD 05/25/14 2003

## 2014-06-12 ENCOUNTER — Other Ambulatory Visit: Payer: Self-pay | Admitting: Urology

## 2014-06-13 ENCOUNTER — Encounter (HOSPITAL_COMMUNITY): Payer: Self-pay | Admitting: *Deleted

## 2014-06-19 ENCOUNTER — Other Ambulatory Visit: Payer: Self-pay | Admitting: Urology

## 2014-06-19 NOTE — Progress Notes (Signed)
Called and spoke with Tasha Tran about time change for lithotripsy on 06/20/14. He verbalizes understanding of time change and NPO after 0830 on 06/20/14. He informs caller that patient had aspirin 06/18/14. Caller asks that they call Dr Rogue Bussing and speak with his nurse.

## 2014-06-21 ENCOUNTER — Encounter (HOSPITAL_COMMUNITY): Payer: Self-pay | Admitting: *Deleted

## 2014-06-24 ENCOUNTER — Encounter (HOSPITAL_COMMUNITY)
Admission: RE | Disposition: A | Discharge status: Home / Self Care | Payer: Self-pay | Source: Ambulatory Visit | Attending: Urology

## 2014-06-24 ENCOUNTER — Encounter (HOSPITAL_COMMUNITY): Payer: Self-pay | Admitting: *Deleted

## 2014-06-24 ENCOUNTER — Ambulatory Visit (HOSPITAL_COMMUNITY): Payer: Medicare Other

## 2014-06-24 ENCOUNTER — Ambulatory Visit (HOSPITAL_COMMUNITY)
Admission: RE | Admit: 2014-06-24 | Discharge: 2014-06-24 | Disposition: A | Discharge status: Home / Self Care | Payer: Medicare Other | Source: Ambulatory Visit | Attending: Urology | Admitting: Urology

## 2014-06-24 DIAGNOSIS — I10 Essential (primary) hypertension: Secondary | ICD-10-CM | POA: Insufficient documentation

## 2014-06-24 DIAGNOSIS — Z7982 Long term (current) use of aspirin: Secondary | ICD-10-CM | POA: Insufficient documentation

## 2014-06-24 DIAGNOSIS — Z87891 Personal history of nicotine dependence: Secondary | ICD-10-CM | POA: Diagnosis not present

## 2014-06-24 DIAGNOSIS — N201 Calculus of ureter: Secondary | ICD-10-CM | POA: Insufficient documentation

## 2014-06-24 DIAGNOSIS — N132 Hydronephrosis with renal and ureteral calculous obstruction: Secondary | ICD-10-CM | POA: Diagnosis present

## 2014-06-24 SURGERY — LITHOTRIPSY, ESWL
Anesthesia: LOCAL | Laterality: Left

## 2014-06-24 MED ORDER — SODIUM CHLORIDE 0.9 % IV SOLN
INTRAVENOUS | Status: DC
  Administered 2014-06-24: 1000 mL via INTRAVENOUS

## 2014-06-24 MED ORDER — CIPROFLOXACIN HCL 500 MG PO TABS
500.0000 mg | ORAL_TABLET | ORAL | Status: AC
  Administered 2014-06-24: 500 mg via ORAL
  Filled 2014-06-24: qty 1

## 2014-06-24 MED ORDER — DIAZEPAM 5 MG PO TABS
10.0000 mg | ORAL_TABLET | ORAL | Status: AC
  Administered 2014-06-24: 10 mg via ORAL
  Filled 2014-06-24: qty 2

## 2014-06-24 MED ORDER — DIPHENHYDRAMINE HCL 25 MG PO CAPS
25.0000 mg | ORAL_CAPSULE | ORAL | Status: AC
  Administered 2014-06-24: 25 mg via ORAL
  Filled 2014-06-24: qty 1

## 2014-06-24 NOTE — Discharge Instructions (Signed)
See [facility] discharge instructions in chart.  

## 2014-06-24 NOTE — H&P (Signed)
History of Present Illness 81F patient presents today in follow-up from the emergency department where she was seen 3 days prior for progressively worsening left flank pain. The patient was found to have a 7 mm left mid ureteral stone with proximal hydronephrosis, questionable forniceal rupture. There were no additional stones within either kidney. The patient did have some cortical atrophy bilaterally. Her urinalysis demonstrated no evidence of infection. Her creatinine was 1.6. White count was normal. The patient was placed on tamsulosin and treated with medical expulsive therapy.    Interval: Patient seen in f/u from 79 weeks prior on medical explusive therapy for a 79mm left mid-ureteral stone. Her stone was not readily apparent on previous KUB. The patient denies any flank pain, hematuria, or fevers or chills. She has had some bladder pressure at night. Otherwise she is asymptomatic.   Past Medical History Problems  1. History of hypertension (Z86.79)  Surgical History Problems  1. History of Ankle Surgery 2. History of Tubal Ligation  Current Meds 1. Aspirin 650 MG TABS;  Therapy: (Recorded:26Apr2016) to Recorded 2. Glucosamine CAPS;  Therapy: (Recorded:26Apr2016) to Recorded 3. Hydrochlorothiazide 25 MG Oral Tablet;  Therapy: (Recorded:26Apr2016) to Recorded 4. Hydrocodone-Acetaminophen 5-325 MG Oral Tablet; TAKE 1 TO 2 TABLETS EVERY 4 TO  6 HOURS AS NEEDED FOR PAIN;  Therapy: 26Apr2016 to (Evaluate:10May2016); Last Rx:26Apr2016 Ordered 5. Lutein TABS;  Therapy: (Recorded:26Apr2016) to Recorded 6. Metoprolol Tartrate 50 MG Oral Tablet;  Therapy: (Recorded:26Apr2016) to Recorded 7. Multivitamins CAPS;  Therapy: (Recorded:26Apr2016) to Recorded 8. Omega-3 CAPS;  Therapy: (Recorded:26Apr2016) to Recorded 9. PredniSONE 20 MG Oral Tablet;  Therapy: (Recorded:26Apr2016) to Recorded 10. Ramipril 10 MG Oral Capsule;   Therapy: (Recorded:26Apr2016) to Recorded 11. Tamsulosin HCl - 0.4  MG Oral Capsule; TAKE 1 CAPSULE Daily;   Therapy: 26Apr2016 to (Evaluate:26May2016)  Requested for: 26Apr2016; Last   Rx:26Apr2016 Ordered 12. Vitamin C TABS;   Therapy: (Recorded:26Apr2016) to Recorded  Allergies Medication  1. No Known Drug Allergies  Family History Problems  1. Family history of Death of family member : Mother, Father   Mother at age 18Father at age 64  Social History Problems  1. Alcohol use (Z78.9)   2 2. Caffeine use (F15.90)   2 3. Former smoker [phone])   1/2 ppd for 30 years and quit 36 years ago 4. Married 5. Number of children   2 sons and 2 daughters 49. Retired  Review of Systems No changes in pts bowel habits, neurological changes, or progressive lower urinary tract symptoms.    Vitals Vital Signs [Data Includes: Last 1 Day]  Recorded: [id number] 10:36AM  Blood Pressure: 147 / 85 Temperature: 97.5 F Heart Rate: 96  Physical Exam Constitutional: Well nourished and well developed . No acute distress.  ENT:. The ears and nose are normal in appearance.  Pulmonary: No respiratory distress and normal respiratory rhythm and effort.  Cardiovascular: Heart rate and rhythm are normal . No peripheral edema.  Abdomen: The abdomen is obese. No right CVA tenderness and left CVA tenderness.  Neuro/Psych:. Mood and affect are appropriate.    Results/Data KUB obtained in clinic today to evaluate for the patient's stomach condition. The renal shadows are visible bilaterally. The stone around the L3/L4 area on the left ureter is visible and does not appear to have changed in location from the patient's CT scan 2 weeks prior. There are no additional calcifications along the expected trajectory of the left ureter. The right kidney appears normal without evidence of stones along the expected  trajectory of the right ureter either. The patient has significant amount of bowel gas which is obscuring the picture somewhat. The bony structures are grossly  normal.  The patient was unable to leave a urinalysis today     Assessment Assessed  1. Left ureteral calculus (N20.1)  Patient has a 7 mm stone in the mid left ureter, currently asymptomatic.   Plan Health Maintenance  1. UA With REFLEX; [Do Not Release]; Status:In Progress - Specimen/Data Collected;    Done: [id number] Left ureteral calculus  2. Follow-up Schedule Surgery Office  Follow-up  Status: Hold For - Appointment   Requested for: [id number] 3. Follow-up Week x 2 Office  Follow-up with KUB  Status: Hold For - Date of Service   Requested for: [id number]  Discussion/Summary We discussed management options including medical expulsion therapy, shockwave lithotripsy, and ureteroscopy. Ultimately, the patient has opted for shock wave lithotripsy. I discussed with the patient the procedure in detail as well as the risk and benefits. The patient is aware that she may need additional procedures. She also is aware of the risks of hematoma and pain. We will try to get this patient's scheduled as soon as possible.

## 2014-06-24 NOTE — Op Note (Signed)
See [facility] OP note scanned into chart. Also because of the size, density, location and other factors that cannot be anticipated I feel this will likely be a staged procedure. This fact supersedes any indication in the scanned [facility] operative note to the contrary.  

## 2014-07-29 ENCOUNTER — Other Ambulatory Visit: Payer: Self-pay

## 2015-06-19 ENCOUNTER — Other Ambulatory Visit: Payer: Self-pay | Admitting: Urology

## 2015-06-26 ENCOUNTER — Encounter (HOSPITAL_BASED_OUTPATIENT_CLINIC_OR_DEPARTMENT_OTHER): Payer: Self-pay | Admitting: *Deleted

## 2015-06-27 ENCOUNTER — Encounter (HOSPITAL_BASED_OUTPATIENT_CLINIC_OR_DEPARTMENT_OTHER): Payer: Self-pay | Admitting: *Deleted

## 2015-06-27 NOTE — Progress Notes (Signed)
NPO AFTER MN.  ARRIVE AT 0745.  NEEDS ISTAT AND EKG.  WILL TAKE NORVASC AND METOPROLOL AM DOS W/ SIPS OF WATER AND IF NEEDED TAKE TYLENOL.

## 2015-07-02 NOTE — Anesthesia Preprocedure Evaluation (Addendum)
Anesthesia Evaluation  Patient identified by MRN, date of birth, ID band Patient awake    Reviewed: Allergy & Precautions, NPO status , Patient's Chart, lab work & pertinent test results, reviewed documented beta blocker date and time   Airway Mallampati: III  TM Distance: <3 FB Neck ROM: Full    Dental  (+) Teeth Intact, Dental Advisory Given, Missing   Pulmonary former smoker,    Pulmonary exam normal breath sounds clear to auscultation       Cardiovascular hypertension, Pt. on medications and Pt. on home beta blockers + DVT (BLE 2004)  Normal cardiovascular exam Rhythm:Regular Rate:Normal     Neuro/Psych negative neurological ROS  negative psych ROS   GI/Hepatic Neg liver ROS, GERD  ,  Endo/Other  Obesity   Renal/GU Renal InsufficiencyRenal diseaseNephrolithiasis      Musculoskeletal  (+) Arthritis ,   Abdominal   Peds  Hematology negative hematology ROS (+)   Anesthesia Other Findings Day of surgery medications reviewed with the patient.  Reproductive/Obstetrics                           Anesthesia Physical Anesthesia Plan  ASA: II  Anesthesia Plan: General   Post-op Pain Management:    Induction: Intravenous  Airway Management Planned: LMA  Additional Equipment:   Intra-op Plan:   Post-operative Plan: Extubation in OR  Informed Consent: I have reviewed the patients History and Physical, chart, labs and discussed the procedure including the risks, benefits and alternatives for the proposed anesthesia with the patient or authorized representative who has indicated his/her understanding and acceptance.   Dental advisory given  Plan Discussed with: CRNA  Anesthesia Plan Comments: (Risks/benefits of general anesthesia discussed with patient including risk of damage to teeth, lips, gum, and tongue, nausea/vomiting, allergic reactions to medications, and the possibility of  heart attack, stroke and death.  All patient questions answered.  Patient wishes to proceed.)       Anesthesia Quick Evaluation

## 2015-07-03 ENCOUNTER — Ambulatory Visit (HOSPITAL_BASED_OUTPATIENT_CLINIC_OR_DEPARTMENT_OTHER)
Admission: RE | Admit: 2015-07-03 | Discharge: 2015-07-03 | Disposition: A | Discharge status: Home / Self Care | Payer: Medicare Other | Source: Ambulatory Visit | Attending: Urology | Admitting: Urology

## 2015-07-03 ENCOUNTER — Encounter (HOSPITAL_BASED_OUTPATIENT_CLINIC_OR_DEPARTMENT_OTHER): Payer: Self-pay | Admitting: *Deleted

## 2015-07-03 ENCOUNTER — Encounter (HOSPITAL_BASED_OUTPATIENT_CLINIC_OR_DEPARTMENT_OTHER)
Admission: RE | Disposition: A | Discharge status: Home / Self Care | Payer: Self-pay | Source: Ambulatory Visit | Attending: Urology

## 2015-07-03 ENCOUNTER — Ambulatory Visit (HOSPITAL_BASED_OUTPATIENT_CLINIC_OR_DEPARTMENT_OTHER): Payer: Medicare Other | Admitting: Anesthesiology

## 2015-07-03 DIAGNOSIS — K219 Gastro-esophageal reflux disease without esophagitis: Secondary | ICD-10-CM | POA: Insufficient documentation

## 2015-07-03 DIAGNOSIS — I10 Essential (primary) hypertension: Secondary | ICD-10-CM | POA: Insufficient documentation

## 2015-07-03 DIAGNOSIS — E669 Obesity, unspecified: Secondary | ICD-10-CM | POA: Insufficient documentation

## 2015-07-03 DIAGNOSIS — Z6837 Body mass index (BMI) 37.0-37.9, adult: Secondary | ICD-10-CM | POA: Insufficient documentation

## 2015-07-03 DIAGNOSIS — Z87891 Personal history of nicotine dependence: Secondary | ICD-10-CM | POA: Diagnosis not present

## 2015-07-03 DIAGNOSIS — N201 Calculus of ureter: Secondary | ICD-10-CM | POA: Insufficient documentation

## 2015-07-03 DIAGNOSIS — Z79899 Other long term (current) drug therapy: Secondary | ICD-10-CM | POA: Insufficient documentation

## 2015-07-03 DIAGNOSIS — M1990 Unspecified osteoarthritis, unspecified site: Secondary | ICD-10-CM | POA: Insufficient documentation

## 2015-07-03 DIAGNOSIS — I4510 Unspecified right bundle-branch block: Secondary | ICD-10-CM | POA: Insufficient documentation

## 2015-07-03 DIAGNOSIS — N200 Calculus of kidney: Secondary | ICD-10-CM

## 2015-07-03 HISTORY — DX: Unspecified osteoarthritis, unspecified site: M19.90

## 2015-07-03 HISTORY — DX: Personal history of other venous thrombosis and embolism: Z86.718

## 2015-07-03 HISTORY — DX: Rash and other nonspecific skin eruption: R21

## 2015-07-03 HISTORY — DX: Calculus of ureter: N20.1

## 2015-07-03 HISTORY — DX: Calculus of kidney: N20.0

## 2015-07-03 HISTORY — DX: Presence of spectacles and contact lenses: Z97.3

## 2015-07-03 HISTORY — PX: CYSTOSCOPY WITH RETROGRADE PYELOGRAM, URETEROSCOPY AND STENT PLACEMENT: SHX5789

## 2015-07-03 HISTORY — DX: Gastro-esophageal reflux disease without esophagitis: K21.9

## 2015-07-03 HISTORY — PX: HOLMIUM LASER APPLICATION: SHX5852

## 2015-07-03 LAB — POCT I-STAT 4, (NA,K, GLUC, HGB,HCT)
Glucose, Bld: 104 mg/dL — ABNORMAL HIGH (ref 65–99)
HEMATOCRIT: 39 % (ref 36.0–46.0)
HEMOGLOBIN: 13.3 g/dL (ref 12.0–15.0)
Potassium: 3.5 mmol/L (ref 3.5–5.1)
Sodium: 143 mmol/L (ref 135–145)

## 2015-07-03 SURGERY — CYSTOURETEROSCOPY, WITH RETROGRADE PYELOGRAM AND STENT INSERTION
Anesthesia: General | Laterality: Left

## 2015-07-03 MED ORDER — DEXAMETHASONE SODIUM PHOSPHATE 10 MG/ML IJ SOLN
INTRAMUSCULAR | Status: AC
  Filled 2015-07-03: qty 1

## 2015-07-03 MED ORDER — LIDOCAINE 2% (20 MG/ML) 5 ML SYRINGE
INTRAMUSCULAR | Status: DC | PRN
  Administered 2015-07-03: 60 mg via INTRAVENOUS

## 2015-07-03 MED ORDER — FENTANYL CITRATE (PF) 100 MCG/2ML IJ SOLN
INTRAMUSCULAR | Status: DC | PRN
  Administered 2015-07-03: 25 ug via INTRAVENOUS
  Administered 2015-07-03: 50 ug via INTRAVENOUS

## 2015-07-03 MED ORDER — CEFAZOLIN SODIUM-DEXTROSE 2-4 GM/100ML-% IV SOLN
2.0000 g | INTRAVENOUS | Status: AC
  Administered 2015-07-03: 2 g via INTRAVENOUS
  Filled 2015-07-03: qty 100

## 2015-07-03 MED ORDER — LACTATED RINGERS IV SOLN
INTRAVENOUS | Status: DC
  Administered 2015-07-03 (×2): via INTRAVENOUS
  Filled 2015-07-03: qty 1000

## 2015-07-03 MED ORDER — DEXAMETHASONE SODIUM PHOSPHATE 4 MG/ML IJ SOLN
INTRAMUSCULAR | Status: DC | PRN
  Administered 2015-07-03: 4 mg via INTRAVENOUS

## 2015-07-03 MED ORDER — DIATRIZOATE MEGLUMINE 30 % UR SOLN
URETHRAL | Status: DC | PRN
  Administered 2015-07-03: 10 mL via URETHRAL

## 2015-07-03 MED ORDER — TRAMADOL HCL 50 MG PO TABS: 50.0000 mg | ORAL_TABLET | Freq: Four times a day (QID) | ORAL | Status: DC | PRN

## 2015-07-03 MED ORDER — LIDOCAINE HCL 2 % EX GEL
CUTANEOUS | Status: AC
  Filled 2015-07-03: qty 5

## 2015-07-03 MED ORDER — FENTANYL CITRATE (PF) 100 MCG/2ML IJ SOLN
INTRAMUSCULAR | Status: AC
  Filled 2015-07-03: qty 2

## 2015-07-03 MED ORDER — ONDANSETRON HCL 4 MG/2ML IJ SOLN
INTRAMUSCULAR | Status: AC
  Filled 2015-07-03: qty 2

## 2015-07-03 MED ORDER — SODIUM CHLORIDE 0.9 % IR SOLN
Status: DC | PRN
  Administered 2015-07-03: 3000 mL via INTRAVESICAL
  Administered 2015-07-03: 1000 mL via INTRAVESICAL

## 2015-07-03 MED ORDER — ONDANSETRON HCL 4 MG/2ML IJ SOLN
4.0000 mg | Freq: Once | INTRAMUSCULAR | Status: DC | PRN
  Filled 2015-07-03: qty 2

## 2015-07-03 MED ORDER — FENTANYL CITRATE (PF) 100 MCG/2ML IJ SOLN
25.0000 ug | INTRAMUSCULAR | Status: DC | PRN
Start: 2015-07-03 — End: 2015-07-03
  Filled 2015-07-03: qty 1

## 2015-07-03 MED ORDER — LIDOCAINE HCL (CARDIAC) 20 MG/ML IV SOLN
INTRAVENOUS | Status: AC
  Filled 2015-07-03: qty 5

## 2015-07-03 MED ORDER — CEFAZOLIN SODIUM 1-5 GM-% IV SOLN
1.0000 g | INTRAVENOUS | Status: DC
  Filled 2015-07-03: qty 50

## 2015-07-03 MED ORDER — PROPOFOL 10 MG/ML IV BOLUS
INTRAVENOUS | Status: DC | PRN
  Administered 2015-07-03: 150 mg via INTRAVENOUS

## 2015-07-03 MED ORDER — EPHEDRINE SULFATE 50 MG/ML IJ SOLN
INTRAMUSCULAR | Status: AC
  Filled 2015-07-03: qty 1

## 2015-07-03 MED ORDER — PHENAZOPYRIDINE HCL 200 MG PO TABS: 200.0000 mg | ORAL_TABLET | Freq: Three times a day (TID) | ORAL | Status: DC | PRN

## 2015-07-03 MED ORDER — PROPOFOL 10 MG/ML IV BOLUS
INTRAVENOUS | Status: AC
  Filled 2015-07-03: qty 20

## 2015-07-03 MED ORDER — BELLADONNA ALKALOIDS-OPIUM 16.2-60 MG RE SUPP
RECTAL | Status: DC | PRN
  Administered 2015-07-03: 1 via RECTAL

## 2015-07-03 MED ORDER — EPHEDRINE SULFATE 50 MG/ML IJ SOLN
INTRAMUSCULAR | Status: DC | PRN
  Administered 2015-07-03 (×2): 5 mg via INTRAVENOUS

## 2015-07-03 MED ORDER — ONDANSETRON HCL 4 MG/2ML IJ SOLN
INTRAMUSCULAR | Status: DC | PRN
  Administered 2015-07-03: 4 mg via INTRAVENOUS

## 2015-07-03 MED ORDER — CIPROFLOXACIN HCL 500 MG PO TABS: 500.0000 mg | ORAL_TABLET | Freq: Once | ORAL | Status: DC

## 2015-07-03 MED ORDER — LIDOCAINE HCL 2 % EX GEL
CUTANEOUS | Status: DC | PRN
  Administered 2015-07-03: 1 via URETHRAL

## 2015-07-03 MED ORDER — WHITE PETROLATUM GEL
Status: AC
  Filled 2015-07-03: qty 5

## 2015-07-03 MED ORDER — CEFAZOLIN SODIUM-DEXTROSE 2-4 GM/100ML-% IV SOLN
INTRAVENOUS | Status: AC
  Filled 2015-07-03: qty 100

## 2015-07-03 MED ORDER — BELLADONNA ALKALOIDS-OPIUM 16.2-60 MG RE SUPP
RECTAL | Status: AC
Start: 2015-07-03 — End: 2015-07-03
  Filled 2015-07-03: qty 1

## 2015-07-03 SURGICAL SUPPLY — 34 items
BAG DRAIN URO-CYSTO SKYTR STRL (DRAIN) ×3 IMPLANT
BAG DRN UROCATH (DRAIN) ×1
BASKET DAKOTA 1.9FR 11X120 (BASKET) IMPLANT
BASKET LASER NITINOL 1.9FR (BASKET) IMPLANT
BASKET STNLS GEMINI 4WIRE 3FR (BASKET) IMPLANT
BASKET STONE 1.7 NGAGE (UROLOGICAL SUPPLIES) ×3 IMPLANT
BASKET ZERO TIP NITINOL 2.4FR (BASKET) IMPLANT
BSKT STON RTRVL 120 1.9FR (BASKET)
BSKT STON RTRVL GEM 120X11 3FR (BASKET)
BSKT STON RTRVL ZERO TP 2.4FR (BASKET)
CATH URET 5FR 28IN OPEN ENDED (CATHETERS) ×3 IMPLANT
CATH URET DUAL LUMEN 6-10FR 50 (CATHETERS) IMPLANT
CLOTH BEACON ORANGE TIMEOUT ST (SAFETY) ×3 IMPLANT
FIBER LASER TRAC TIP (UROLOGICAL SUPPLIES) ×3 IMPLANT
GLOVE BIO SURGEON STRL SZ7.5 (GLOVE) ×3 IMPLANT
GOWN STRL REUS W/ TWL XL LVL3 (GOWN DISPOSABLE) ×1 IMPLANT
GOWN STRL REUS W/TWL XL LVL3 (GOWN DISPOSABLE) ×3
GUIDEWIRE 0.038 PTFE COATED (WIRE) IMPLANT
GUIDEWIRE ANG ZIPWIRE 038X150 (WIRE) IMPLANT
GUIDEWIRE STR DUAL SENSOR (WIRE) ×5 IMPLANT
IV NS IRRIG 3000ML ARTHROMATIC (IV SOLUTION) ×4 IMPLANT
KIT BALLIN UROMAX 15FX10 (LABEL) IMPLANT
KIT BALLN UROMAX 15FX4 (MISCELLANEOUS) IMPLANT
KIT BALLN UROMAX 26 75X4 (MISCELLANEOUS)
KIT ROOM TURNOVER WOR (KITS) ×3 IMPLANT
MANIFOLD NEPTUNE II (INSTRUMENTS) IMPLANT
NS IRRIG 500ML POUR BTL (IV SOLUTION) IMPLANT
PACK CYSTO (CUSTOM PROCEDURE TRAY) ×3 IMPLANT
SET HIGH PRES BAL DIL (LABEL)
SHEATH ACCESS URETERAL 38CM (SHEATH) IMPLANT
STENT URET 6FRX24 CONTOUR (STENTS) ×3 IMPLANT
TUBE CONNECTING 12'X1/4 (SUCTIONS) ×1
TUBE CONNECTING 12X1/4 (SUCTIONS) ×1 IMPLANT
TUBE FEEDING 8FR 16IN STR KANG (MISCELLANEOUS) IMPLANT

## 2015-07-03 NOTE — Transfer of Care (Signed)
Immediate Anesthesia Transfer of Care Note  Patient: ODELLE YANOS  Procedure(s) Performed: Procedure(s) (LRB): CYSTOSCOPY WITH LEFT RETROGRADE PYELOGRAM, LEFT URETEROSCOPY, LASER LITHOTRIPSY, STONE BASKET EXTRACTION AND DOUBLE J STENT PLACEMENT (Left) HOLMIUM LASER APPLICATION (Left)  Patient Location: PACU  Anesthesia Type: General  Level of Consciousness: awake, oriented, sedated and patient cooperative  Airway & Oxygen Therapy: Patient Spontanous Breathing and Patient connected to face mask oxygen  Post-op Assessment: Report given to PACU RN and Post -op Vital signs reviewed and stable  Post vital signs: Reviewed and stable  Complications: No apparent anesthesia complications Last Vitals:  Filed Vitals:   07/03/15 0731 07/03/15 1041  BP: 146/80 125/73  Pulse: 95 98  Temp: 36.4 C 36.6 C  Resp: 16 16

## 2015-07-03 NOTE — Anesthesia Postprocedure Evaluation (Signed)
Anesthesia Post Note  Patient: Tasha Tran  Procedure(s) Performed: Procedure(s) (LRB): CYSTOSCOPY WITH LEFT RETROGRADE PYELOGRAM, LEFT URETEROSCOPY, LASER LITHOTRIPSY, STONE BASKET EXTRACTION AND DOUBLE J STENT PLACEMENT (Left) HOLMIUM LASER APPLICATION (Left)  Patient location during evaluation: PACU Anesthesia Type: General Level of consciousness: awake and alert Pain management: pain level controlled Vital Signs Assessment: post-procedure vital signs reviewed and stable Respiratory status: spontaneous breathing, nonlabored ventilation, respiratory function stable and patient connected to nasal cannula oxygen Cardiovascular status: blood pressure returned to baseline and stable Postop Assessment: no signs of nausea or vomiting Anesthetic complications: no    Last Vitals:  Filed Vitals:   07/03/15 1100 07/03/15 1115  BP: 137/76 149/79  Pulse: 87 91  Temp:    Resp: 15 18    Last Pain:  Filed Vitals:   07/03/15 1124  PainSc: 0-No pain                 Catalina Gravel

## 2015-07-03 NOTE — Op Note (Deleted)
Preoperative diagnosis:  1. Left ureteral stone   Postoperative diagnosis:  1. same   Procedure: 1. Left ureteroscopy  Surgeon: Ardis Hughs, MD  Anesthesia: General  Complications: None  Intraoperative findings: A left Retrograde pyelogram was performed using 10 mL of Cystografin and demonstrating a normal caliber ureter with no evidence of filling defects within the ureter or the renal pelvis. The stone located in the patient's mid left ureter was pushed up into the renal pelvis. I encountered 2 stones within the left lower pole which I fragmented and removed with a engage basket.  EBL: Minimal  Specimens: None  Indication: Tasha Tran is a 82 y.o. patient with mid left ureteral stone, she has undergone shockwave lithotripsy which failed to fragment the stone completely. She was relatively asymptomatic for quite some time and lost to follow-up. We contacted the patient reminded her that she still had a left mid ureteral stone which was confirmed by repeat KUB.Marland Kitchen  After reviewing the management options for treatment, he elected to proceed with the above surgical procedure(s). We have discussed the potential benefits and risks of the procedure, side effects of the proposed treatment, the likelihood of the patient achieving the goals of the procedure, and any potential problems that might occur during the procedure or recuperation. Informed consent has been obtained.  Description of procedure:  The patient was taken to the operating room and general anesthesia was induced.  The patient was placed in the dorsal lithotomy position, prepped and draped in the usual sterile fashion, and preoperative antibiotics were administered. A preoperative time-out was performed.   A 21 French 30 cystoscope was then gently passed to the patient's urethra into the bladder. 360 cystoscopic evaluation of the latter demonstrated normal bladder mucosa with out any bladder tumors, stitches, or  stones. The bladder was slightly prolapsed. The ureteral orifices were orthotopic. Then using a 5 [place] open-ended ureteral catheter I cannulated the left ureteral orifice and performed a retrograde pyelogram has mentioned above. I then advanced a 0.38 sensor wire through the beta ureteral catheter and into the left renal pelvis. The catheter was then removed and the cystoscope was also removed. I then advanced a 6/4 short dual-lumen semirigid ureteroscope through the left ureteral orifice and up into the left renal pelvis. There are no stones noted in the left ureter. I then did advanced a second wire through the scope and removed the scope over the wire. I then advanced the flexible ureteroscope over the wire and into the left distal ureter. I removed the wire and advanced the scope up into the left renal pelvis under visual guidance. I performed pyeloscopy and found stones in the lower pole. Then using the engage basket I removed the stones into the upper pole. Using a 200  fiber with settings of 1 J and 10 Hz I fragmented the stone into small fragments. I then passed a wire through the flexible ureteroscope and backed it out over the wire. I then advanced a 12/14 [place] times 24 cm ureteral access sheath over the second wire and into the proximal ureter. Then through the access sheath and using the flexible ureteroscope I was able to move the stone fragments relatively quickly. Once all the stone fragments had been removed and repeated my pyeloscopy noting no additional stones within the kidney. I then backed out the scope under visual guidance removing the excess she simultaneously. I then advanced a 24 cm times 6 French double-J stent over the wire and advanced it  up into the left renal pelvis under fluoroscopy. Then with pressure on the wire against the pubic bone and retracted the wire noting a nice curl in the left renal pelvis. I was able to push the distal tip of the stent into the bladder using the  beak of the cystoscope. This tether of the stent was then left into the patient's vagina. I placed a B&O suppository and instilled urethral lidocaine jelly. The patient was subsequently extubated and returned to PACU in stable condition.  Ardis Hughs, M.D.

## 2015-07-03 NOTE — Discharge Instructions (Signed)
DISCHARGE INSTRUCTIONS FOR KIDNEY STONE/URETERAL STENT   MEDICATIONS:  1.  Resume all your other meds from home - except do not take any extra narcotic pain meds that you may have at home.  2. Pyridium is to help with the burning/stinging when you urinate. 3. Tramadol is for moderate/severe pain, otherwise taking upto 1000mg  every 6 hours of plainTylenol will help treat your pain.  4. Take Cipro one hour prior to removal of your stent.   ACTIVITY:  1. No strenuous activity x 1week  2. No driving while on narcotic pain medications  3. Drink plenty of water  4. Continue to walk at home - you can still get blood clots when you are at home, so keep active, but don't over do it.  5. May return to work/school tomorrow or when you feel ready   BATHING:  1. You can shower and we recommend daily showers  2. You have a string coming from your urethra: The stent string is attached to your ureteral stent. Do not pull on this.   SIGNS/SYMPTOMS TO CALL:  Please call us if you have a fever greater than 101.5, uncontrolled nausea/vomiting, uncontrolled pain, dizziness, unable to urinate, bloody urine, chest pain, shortness of breath, leg swelling, leg pain, redness around wound, drainage from wound, or any other concerns or questions.   You can reach [place] at [phone].   FOLLOW-UP:  1. You have an appointment in 6 weeks with a ultrasound of your kidneys prior.  2. You have a string attached to your stent, you may remove it on Monday, June 5th. To do this, pull the strings until the stents are completely removed. You may feel an odd sensation in your back.  Post Anesthesia Home Care Instructions  Activity: Get plenty of rest for the remainder of the day. A responsible adult should stay with you for 24 hours following the procedure.  For the next 24 hours, DO NOT: -Drive a car -Paediatric nurse -Drink alcoholic beverages -Take any medication unless instructed by your physician -Make any legal  decisions or sign important papers.  Meals: Start with liquid foods such as gelatin or soup. Progress to regular foods as tolerated. Avoid greasy, spicy, heavy foods. If nausea and/or vomiting occur, drink only clear liquids until the nausea and/or vomiting subsides. Call your physician if vomiting continues.  Special Instructions/Symptoms: Your throat may feel dry or sore from the anesthesia or the breathing tube placed in your throat during surgery. If this causes discomfort, gargle with warm salt water. The discomfort should disappear within 24 hours.  If you had a scopolamine patch placed behind your ear for the management of post- operative nausea and/or vomiting:  1. The medication in the patch is effective for 72 hours, after which it should be removed.  Wrap patch in a tissue and discard in the trash. Wash hands thoroughly with soap and water. 2. You may remove the patch earlier than 72 hours if you experience unpleasant side effects which may include dry mouth, dizziness or visual disturbances. 3. Avoid touching the patch. Wash your hands with soap and water after contact with the patch.

## 2015-07-03 NOTE — H&P (Signed)
History of Present Illness Pt was seen in August 2016 for evaluation of a left ureteral calculus. She had previously underwent ESWL earlier in 2016 and was noted to not completely clear the kidney stone but was having only minimal symptoms.  Since last being evaluated, she's not had any bothersome lower urinary tract symptoms or kidney stone events. There has not been any noted passage of any stones nor she had any renal colic. She denies any recent fevers or infections.   Past Medical History Problems  1. History of hypertension (Z86.79)  Surgical History Problems  1. History of Ankle Surgery 2. History of Renal Lithotripsy 3. History of Tubal Ligation  Current Meds 1. Aspirin Low Dose 81 MG TABS;  Therapy: (Recorded:04May2017) to Recorded 2. Glucosamine CAPS;  Therapy: (Recorded:26Apr2016) to Recorded 3. HydroCHLOROthiazide 25 MG Oral Tablet;  Therapy: (Recorded:26Apr2016) to Recorded 4. Lutein TABS;  Therapy: (Recorded:26Apr2016) to Recorded 5. Metoprolol Tartrate 50 MG Oral Tablet;  Therapy: (Recorded:26Apr2016) to Recorded 6. Multivitamins CAPS;  Therapy: (Recorded:26Apr2016) to Recorded 7. Omega-3 CAPS;  Therapy: (Recorded:26Apr2016) to Recorded 8. Ramipril 10 MG Oral Capsule;  Therapy: (Recorded:26Apr2016) to Recorded 9. Vitamin C TABS;  Therapy: (Recorded:26Apr2016) to Recorded  Allergies Medication  1. No Known Drug Allergies  Family History Problems  1. Family history of Death of family member : Mother, Father   Mother at age 54Father at age 74  Social History Problems  1. Alcohol use (Z78.9)   2 2. Caffeine use (F15.90)   2 3. Former smoker [phone]   1/2 ppd for 30 years and quit 36 years ago 37. Married 5. Number of children   2 sons and 2 daughters 29. Retired  Review of Systems Genitourinary, constitutional, skin, eye, otolaryngeal, hematologic/lymphatic, cardiovascular, pulmonary, endocrine, musculoskeletal, gastrointestinal, neurological  and psychiatric system(s) were reviewed and pertinent findings if present are noted and are otherwise negative.    Vitals Vital Signs [Data Includes: Last 1 Day]  Recorded: [id number] 01:35PM  Blood Pressure: 139 / 92 Temperature: 98.2 F Heart Rate: 83  Physical Exam Constitutional: Well nourished and well developed . No acute distress.  ENT:. The ears and nose are normal in appearance.  Neck: The appearance of the neck is normal and no neck mass is present.  Pulmonary: No respiratory distress and normal respiratory rhythm and effort.  Cardiovascular: Heart rate and rhythm are normal . No peripheral edema.  Abdomen: The abdomen is soft and nontender. No masses are palpated. No CVA tenderness. No hernias are palpable. No hepatosplenomegaly noted.  Skin: Normal skin turgor, no visible rash and no visible skin lesions.  Neuro/Psych:. Mood and affect are appropriate.    Results/Data Urine [Data Includes: Last 1 Day]   [id number]  COLOR YELLOW   APPEARANCE CLEAR   SPECIFIC GRAVITY 1.015   pH 7.0   GLUCOSE NEGATIVE   BILIRUBIN NEGATIVE   KETONE NEGATIVE   BLOOD TRACE   PROTEIN TRACE   NITRITE NEGATIVE   LEUKOCYTE ESTERASE 1+   SQUAMOUS EPITHELIAL/HPF 6-10 HPF  WBC 0-5 WBC/HPF  RBC 0-2 RBC/HPF  BACTERIA  HPF  CRYSTALS NONE SEEN HPF  CASTS NONE SEEN LPF  Yeast NONE SEEN HPF   The following clinical lab reports were reviewed:  KUB: There is a proximal ureteral stone noted along the anatomical expected tract of the left ureter. There is also a nonobstructing lower pole calculus in the contours of the left renal shadow, mostly unchanged from her last assessment.  The following medical tests were reviewed: UA with  some squamous cells but no significant microscopic abnormality.    Assessment Assessed  1. Left ureteral calculus (N20.1)  Plan Health Maintenance  1. UA With REFLEX; [Do Not Release]; Status:Complete;   [id number] 01:06PM  Plan for ureteroscopy.  I think this is  a good move for the patient to have electively before she could potentially become symptomatic.   Discussion/Summary For ureteroscopy I described the risks which include heart attack, stroke, pulmonary embolus, death, bleeding, infection, damage to contiguous structures, positioning injury, ureteral stricture, ureteral avulsion, ureteral injury, need for ureteral stent, inability to perform ureteroscopy, need for an interval procedure, inability to clear stone burden, stent discomfort and pain.

## 2015-07-03 NOTE — Anesthesia Procedure Notes (Signed)
Procedure Name: LMA Insertion Date/Time: 07/03/2015 9:38 AM Performed by: Denna Haggard D Pre-anesthesia Checklist: Patient identified, Emergency Drugs available, Suction available and Patient being monitored Patient Re-evaluated:Patient Re-evaluated prior to inductionOxygen Delivery Method: Circle system utilized Preoxygenation: Pre-oxygenation with 100% oxygen Intubation Type: IV induction Ventilation: Mask ventilation without difficulty LMA: LMA inserted LMA Size: 4.0 Number of attempts: 1 Airway Equipment and Method: Bite block Placement Confirmation: positive ETCO2 Tube secured with: Tape Dental Injury: Teeth and Oropharynx as per pre-operative assessment

## 2015-07-04 ENCOUNTER — Encounter (HOSPITAL_BASED_OUTPATIENT_CLINIC_OR_DEPARTMENT_OTHER): Payer: Self-pay | Admitting: Urology

## 2015-07-10 NOTE — Op Note (Signed)
Preoperative diagnosis:  1. Left ureteral stone   Postoperative diagnosis:  1. same   Procedure: 1. Cystoscopy, left retrograde pyelogram with interpretation 2. Left ureteroscopy with laser lithotripsy and stone removal 3. Left ureteral stent placement  Surgeon: Ardis Hughs, MD  Anesthesia: General  Complications: None  Intraoperative findings: A left Retrograde pyelogram was performed using 10 mL of Cystografin and demonstrating a normal caliber ureter with no evidence of filling defects within the ureter or the renal pelvis. The stone located in the patient's mid left ureter was pushed up into the renal pelvis. I encountered 2 stones within the left lower pole which I fragmented and removed with a engage basket.  EBL: Minimal  Specimens: None  Indication: Tasha Tran is a 82 y.o. patient with mid left ureteral stone, she has undergone shockwave lithotripsy which failed to fragment the stone completely. She was relatively asymptomatic for quite some time and lost to follow-up. We contacted the patient reminded her that she still had a left mid ureteral stone which was confirmed by repeat KUB.Marland Kitchen  After reviewing the management options for treatment, he elected to proceed with the above surgical procedure(s). We have discussed the potential benefits and risks of the procedure, side effects of the proposed treatment, the likelihood of the patient achieving the goals of the procedure, and any potential problems that might occur during the procedure or recuperation. Informed consent has been obtained.  Description of procedure:  The patient was taken to the operating room and general anesthesia was induced.  The patient was placed in the dorsal lithotomy position, prepped and draped in the usual sterile fashion, and preoperative antibiotics were administered. A preoperative time-out was performed.   A 21 French 30 cystoscope was then gently passed to the patient's urethra  into the bladder. 360 cystoscopic evaluation of the latter demonstrated normal bladder mucosa with out any bladder tumors, stitches, or stones. The bladder was slightly prolapsed. The ureteral orifices were orthotopic. Then using a 5 [place] open-ended ureteral catheter I cannulated the left ureteral orifice and performed a retrograde pyelogram has mentioned above. I then advanced a 0.38 sensor wire through the beta ureteral catheter and into the left renal pelvis. The catheter was then removed and the cystoscope was also removed. I then advanced a 6/4 short dual-lumen semirigid ureteroscope through the left ureteral orifice and up into the left renal pelvis. There are no stones noted in the left ureter. I then did advanced a second wire through the scope and removed the scope over the wire. I then advanced the flexible ureteroscope over the wire and into the left distal ureter. I removed the wire and advanced the scope up into the left renal pelvis under visual guidance. I performed pyeloscopy and found stones in the lower pole. Then using the engage basket I removed the stones into the upper pole. Using a 200  fiber with settings of 1 J and 10 Hz I fragmented the stone into small fragments. I then passed a wire through the flexible ureteroscope and backed it out over the wire. I then advanced a 12/14 [place] times 24 cm ureteral access sheath over the second wire and into the proximal ureter. Then through the access sheath and using the flexible ureteroscope I was able to move the stone fragments relatively quickly. Once all the stone fragments had been removed and repeated my pyeloscopy noting no additional stones within the kidney. I then backed out the scope under visual guidance removing the excess she  simultaneously. I then advanced a 24 cm times 6 French double-J stent over the wire and advanced it up into the left renal pelvis under fluoroscopy. Then with pressure on the wire against the pubic bone and  retracted the wire noting a nice curl in the left renal pelvis. I was able to push the distal tip of the stent into the bladder using the beak of the cystoscope. This tether of the stent was then left into the patient's vagina. I placed a B&O suppository and instilled urethral lidocaine jelly. The patient was subsequently extubated and returned to PACU in stable condition.  Ardis Hughs, M.D.

## 2015-11-25 ENCOUNTER — Other Ambulatory Visit: Payer: Self-pay | Admitting: Dermatology

## 2016-05-17 IMAGING — CT CT ABD-PELV W/O CM
2 of 4 series · 12 of 46 positions shown, 14 images · non-contrast
Comparison: No similar prior exam is available at this institution
for comparison or on [HOSPITAL] PACS.

CLINICAL DATA: Left flank pain for 2 weeks episodically with
hematuria

EXAM:
CT ABDOMEN AND PELVIS WITHOUT CONTRAST
TECHNIQUE: Multidetector CT imaging of the abdomen and pelvis was performed
following the standard protocol without IV contrast.

[Series 201: stone study, idose (2) · axial · 0.85mm/px · z∈[+124,+468]mm · 9 of 83 slices shown, 11 images]
[im 7/83  soft-tissue]
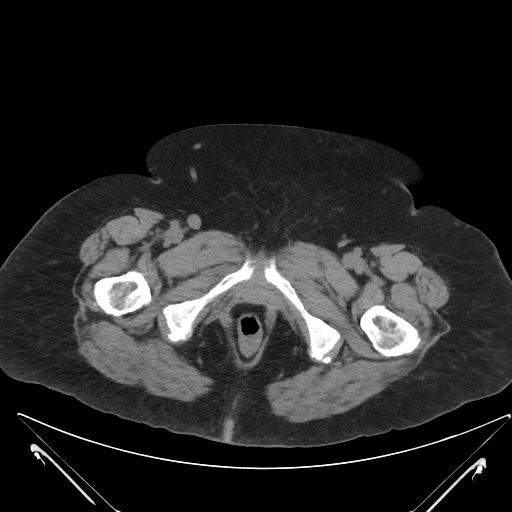
[im 7/83  bone]
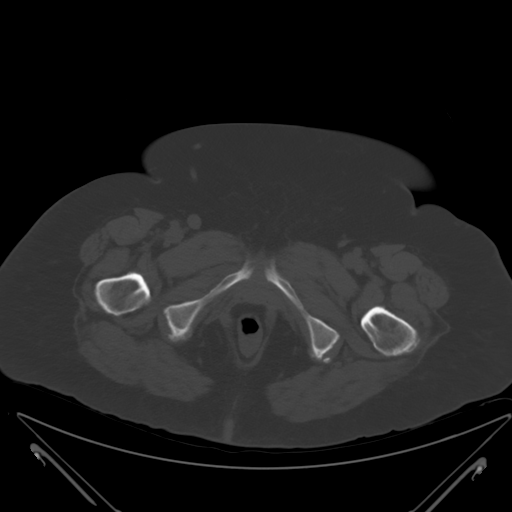
[im 14/83  soft-tissue]
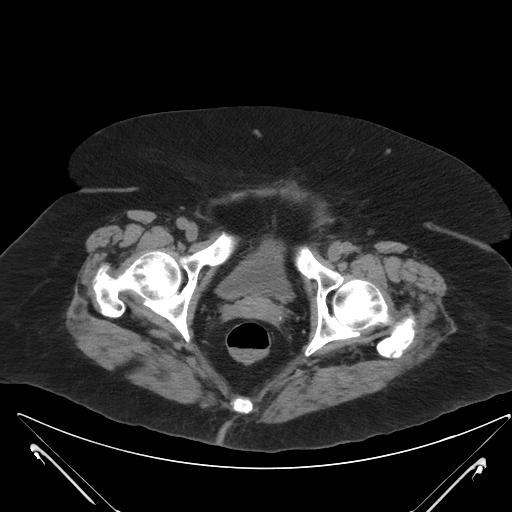
[im 24/83  soft-tissue]
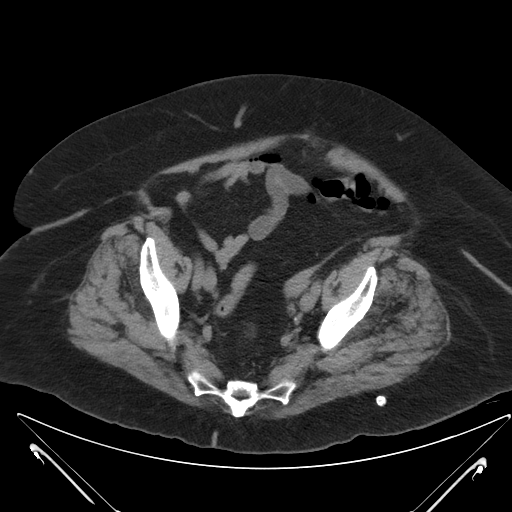
[im 31/83  soft-tissue]
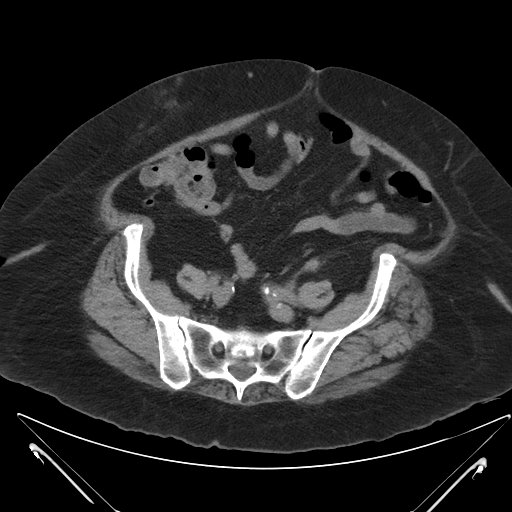
[im 42/83  soft-tissue]
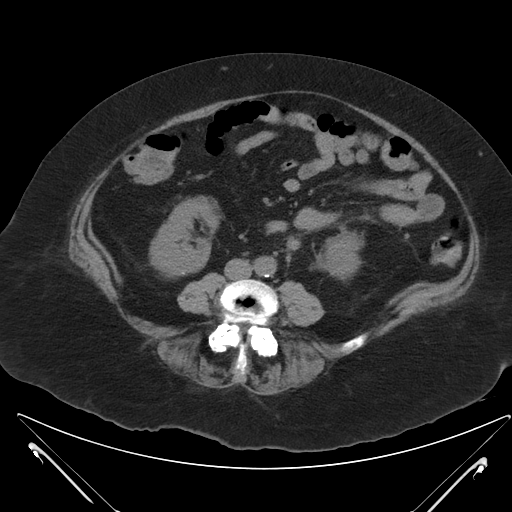
[im 52/83  soft-tissue]
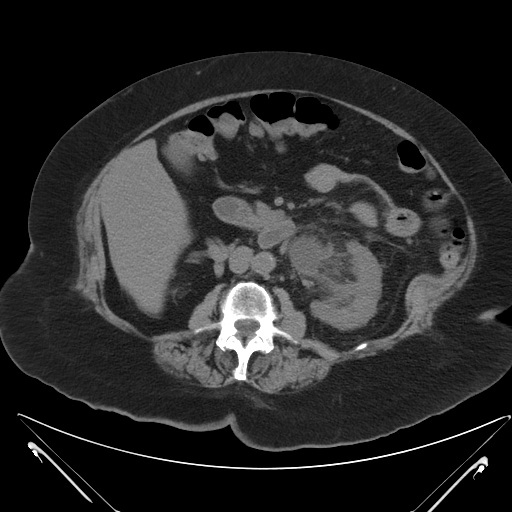
[im 59/83  soft-tissue]
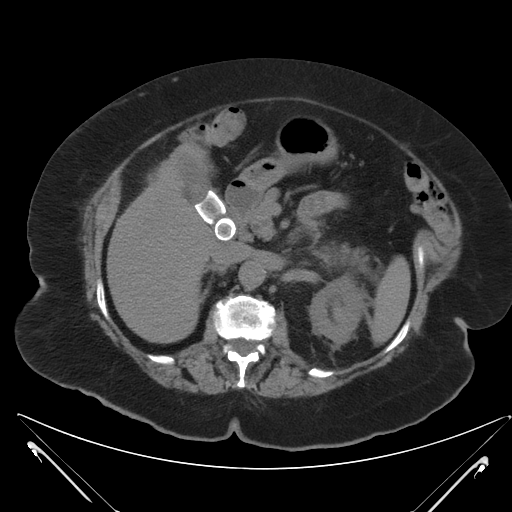
[im 69/83  soft-tissue]
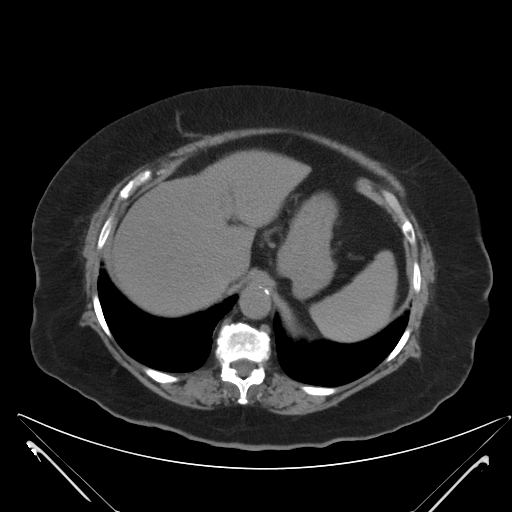
[im 76/83  soft-tissue]
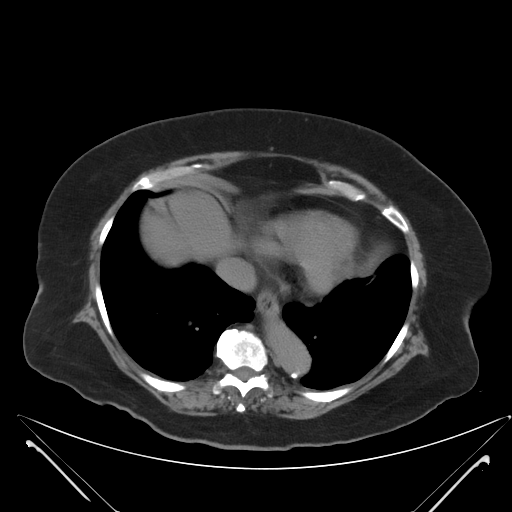
[im 76/83  bone]
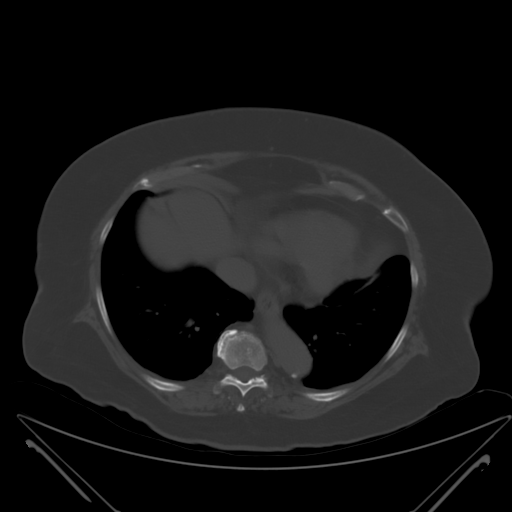

[Series 202: coronals, idose (2) · coronal · 0.50mm/px · 3 of 151 slices shown]
[im 51/151  soft-tissue]
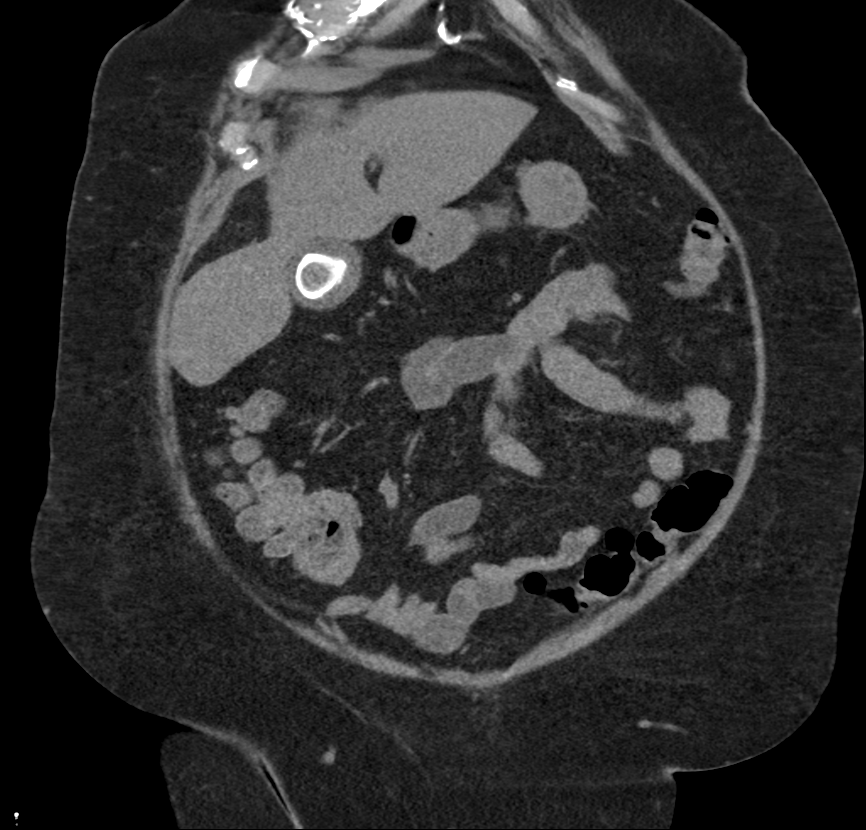
[im 67/151  soft-tissue]
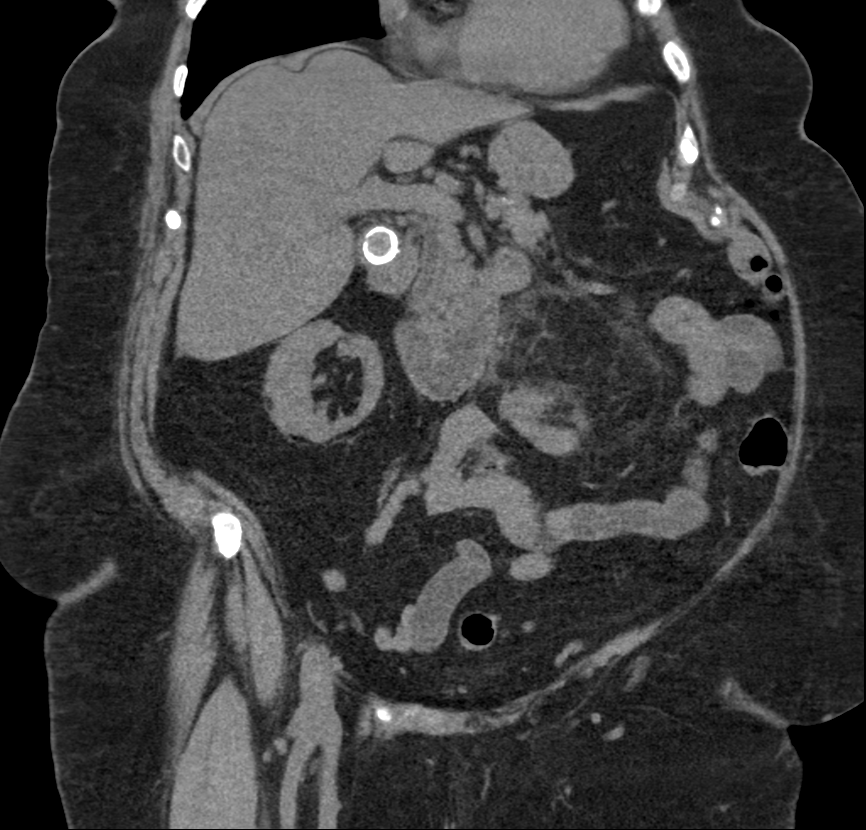
[im 84/151  soft-tissue]
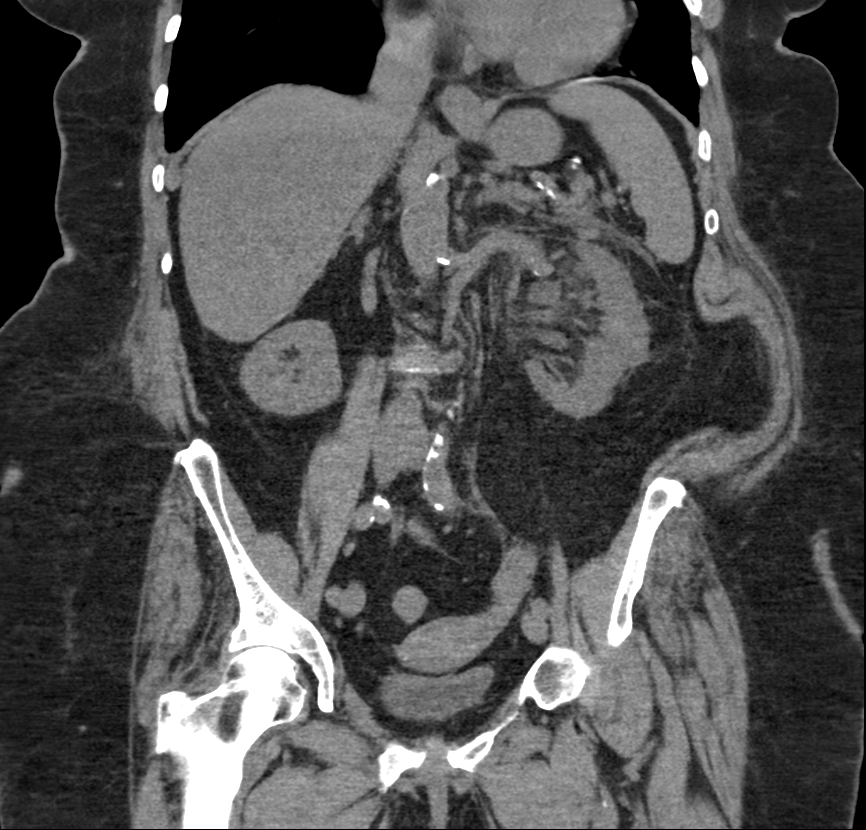

[12 of 46 positions shown; findings below may reference images not displayed]

FINDINGS: Lower chest:  Lung bases are clear.

Hepatobiliary: Gallstones noted without CT evidence for acute
cholecystitis. Liver is unremarkable allowing for lack of contrast.

Pancreas: Normal

Spleen: Normal

Adrenals/Urinary Tract: Moderate left hydroureteronephrosis is
identified to the level of a 8 mm proximal left ureteral calculus
image 36. Moderate left perinephric fluid may indicate forniceal
rupture. Nonobstructing 2 mm left upper renal pole calculus image
27. Renal cortical lobulation is identified bilaterally with
low-density lesions which could represent cysts but are not further
evaluated. No right hydroureteronephrosis. No right ureteral
calculus.

Stomach/Bowel: Stomach is normal. No bowel wall thickening or focal
segmental dilatation is identified. Normal appendix.

Vascular/Lymphatic: Moderate atheromatous aortic calcification
without aneurysm. No lymphadenopathy.

Reproductive: 1.9 cm hypodense mass within the right lateral uterine
body could represent a lipoleiomyoma. Ovaries appear normal.

Other: No free air or fluid.

Musculoskeletal: Multilevel disc degenerative change noted in the
thoracolumbar spine. No acute osseous abnormality.
IMPRESSION: 8 mm proximal left ureteral calculus producing moderate proximal
left hydroureteronephrosis. Moderate left perinephric fluid may
indicate forniceal rupture.

## 2017-04-04 ENCOUNTER — Encounter: Payer: Self-pay | Admitting: Neurology

## 2017-06-17 ENCOUNTER — Other Ambulatory Visit: Payer: Self-pay

## 2017-06-17 ENCOUNTER — Encounter: Payer: Self-pay | Admitting: Neurology

## 2017-06-17 ENCOUNTER — Ambulatory Visit: Payer: Medicare Other | Admitting: Neurology

## 2017-06-17 ENCOUNTER — Encounter

## 2017-06-17 VITALS — BP 130/68 | HR 80 | Ht 61.0 in | Wt 177.0 lb

## 2017-06-17 DIAGNOSIS — G514 Facial myokymia: Secondary | ICD-10-CM | POA: Diagnosis not present

## 2017-06-17 DIAGNOSIS — F0390 Unspecified dementia without behavioral disturbance: Secondary | ICD-10-CM | POA: Diagnosis not present

## 2017-06-17 DIAGNOSIS — F03A0 Unspecified dementia, mild, without behavioral disturbance, psychotic disturbance, mood disturbance, and anxiety: Secondary | ICD-10-CM

## 2017-06-17 MED ORDER — DIVALPROEX SODIUM ER 250 MG PO TB24: 250.0000 mg | ORAL_TABLET | Freq: Every day | ORAL | 3 refills | Status: DC
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
## 2017-06-17 NOTE — Patient Instructions (Addendum)
1. Schedule MRI brain with and without contrast  We have sent a referral to Tasha Tran for your MRI and they will call you directly to schedule your appt. They are located at [place]. If you need to contact them directly please call [phone].   2. Start Depakote ER 250mg  every night 3. Recommend having family administer or check behind you with the medications 4. Follow-up in 6 months, call for any changes  FALL PRECAUTIONS: Be cautious when walking. Scan the area for obstacles that may increase the risk of trips and falls. When getting up in the mornings, sit up at the edge of the bed for a few minutes before getting out of bed. Consider elevating the bed at the head end to avoid drop of blood pressure when getting up. Walk always in a well-lit room (use night lights in the walls). Avoid area rugs or power cords from appliances in the middle of the walkways. Use a walker or a cane if necessary and consider physical therapy for balance exercise. Get your eyesight checked regularly.  FINANCIAL OVERSIGHT: Supervision, especially oversight when making financial decisions or transactions is also recommended.  HOME SAFETY: Consider the safety of the kitchen when operating appliances like stoves, microwave oven, and blender. Consider having supervision and share cooking responsibilities until no longer able to participate in those. Accidents with firearms and other hazards in the house should be identified and addressed as well.  DRIVING: Regarding driving, in patients with progressive memory problems, driving will be impaired. We advise to have someone else do the driving if trouble finding directions or if minor accidents are reported. Independent driving assessment is available to determine safety of driving.  ABILITY TO BE LEFT ALONE: If patient is unable to contact 911 operator, consider using LifeLine, or when the need is there, arrange for someone to stay with patients.  Smoking is a fire hazard, consider supervision or cessation. Risk of wandering should be assessed by caregiver and if detected at any point, supervision and safe proof recommendations should be instituted.  MEDICATION SUPERVISION: Inability to self-administer medication needs to be constantly addressed. Implement a mechanism to ensure safe administration of the medications.  RECOMMENDATIONS FOR ALL PATIENTS WITH MEMORY PROBLEMS: 1. Continue to exercise (Recommend 30 minutes of walking everyday, or 3 hours every week) 2. Increase social interactions - continue going to [place] and enjoy social gatherings with friends and family 3. Eat healthy, avoid fried foods and eat more fruits and vegetables 4. Maintain adequate blood pressure, blood sugar, and blood cholesterol level. Reducing the risk of stroke and cardiovascular disease also helps promoting better memory. 5. Avoid stressful situations. Live a simple life and avoid aggravations. Organize your time and prepare for the next day in anticipation. 6. Sleep well, avoid any interruptions of sleep and avoid any distractions in the bedroom that may interfere with adequate sleep quality 7. Avoid sugar, avoid sweets as there is a strong link between excessive sugar intake, diabetes, and cognitive impairment The Mediterranean diet has been shown to help patients reduce the risk of progressive memory disorders and reduces cardiovascular risk. This includes eating fish, eat fruits and green leafy vegetables, nuts like almonds and hazelnuts, walnuts, and also use olive oil. Avoid fast foods and fried foods as much as possible. Avoid sweets and sugar as sugar use has been linked to worsening of memory function.  There is always a concern of gradual progression of memory problems. If this is the case, then we  may need to adjust level of care according to patient needs. Support, both to the patient and caregiver, should then be put into place.

## 2017-07-06 ENCOUNTER — Ambulatory Visit
Admission: RE | Admit: 2017-07-06 | Discharge: 2017-07-06 | Disposition: A | Discharge status: Home / Self Care | Payer: Medicare Other | Source: Ambulatory Visit | Attending: Neurology | Admitting: Neurology

## 2017-07-06 DIAGNOSIS — G514 Facial myokymia: Secondary | ICD-10-CM

## 2017-07-06 DIAGNOSIS — F0390 Unspecified dementia without behavioral disturbance: Secondary | ICD-10-CM

## 2017-07-06 DIAGNOSIS — F03A0 Unspecified dementia, mild, without behavioral disturbance, psychotic disturbance, mood disturbance, and anxiety: Secondary | ICD-10-CM

## 2017-07-06 MED ORDER — GADOBENATE DIMEGLUMINE 529 MG/ML IV SOLN
15.0000 mL | Freq: Once | INTRAVENOUS | Status: AC | PRN
  Administered 2017-07-06: 15 mL via INTRAVENOUS

## 2017-07-12 ENCOUNTER — Other Ambulatory Visit: Payer: Self-pay | Admitting: Dermatology

## 2017-07-28 ENCOUNTER — Telehealth: Payer: Self-pay | Admitting: Neurology

## 2017-07-28 NOTE — Telephone Encounter (Signed)
Pls let daughter know the MRI brain did not show any evidence of tumor, stroke, or bleed. It did show age-related changes with diffuse atrophy. Brain scans do not diagnose dementia, we use it more to rule out other potential causes of memory issues. How is she doing? Thanks

## 2017-07-28 NOTE — Telephone Encounter (Signed)
Spoke with pt's daughter, Belenda Cruise, relaying message below.  She states that pt is doing a little better, however she does not remember things from day to day.  States that pt gets upset if anyone mentions that she can't/shouldn't drive so this is a topic that family has decided to no longer bring up with her.  States that although pt cannot remember most things she is still able to take care of herself and perform all ADL's.

## 2017-07-28 NOTE — Telephone Encounter (Signed)
Patient daughter would like to talk to someone to get the MRI results. Please call her

## 2017-10-06 ENCOUNTER — Ambulatory Visit
Admission: RE | Admit: 2017-10-06 | Discharge: 2017-10-06 | Disposition: A | Discharge status: Home / Self Care | Payer: Medicare Other | Source: Ambulatory Visit | Attending: Geriatric Medicine | Admitting: Geriatric Medicine

## 2017-10-06 ENCOUNTER — Other Ambulatory Visit: Payer: Self-pay | Admitting: Geriatric Medicine

## 2017-10-06 DIAGNOSIS — M5489 Other dorsalgia: Secondary | ICD-10-CM

## 2017-12-23 MED FILL — RAMIPRIL 10 MG CAPSULE: 10 | 90 days supply | Qty: 90 | Fill #0

## 2018-01-06 MED FILL — METOPROLOL SUCCINATE ER 25: 25 | 30 days supply | Qty: 30 | Fill #0

## 2018-01-13 ENCOUNTER — Encounter: Payer: Self-pay | Admitting: Neurology

## 2018-01-13 ENCOUNTER — Ambulatory Visit: Payer: Medicare Other | Admitting: Neurology

## 2018-01-13 ENCOUNTER — Other Ambulatory Visit: Payer: Self-pay

## 2018-01-13 VITALS — BP 108/66 | HR 82 | Ht 60.0 in | Wt 174.0 lb

## 2018-01-13 DIAGNOSIS — F0390 Unspecified dementia without behavioral disturbance: Secondary | ICD-10-CM

## 2018-01-13 DIAGNOSIS — F03A0 Unspecified dementia, mild, without behavioral disturbance, psychotic disturbance, mood disturbance, and anxiety: Secondary | ICD-10-CM

## 2018-01-13 MED ORDER — DONEPEZIL HCL 10 MG PO TABS: ORAL_TABLET | ORAL | 11 refills | Status: DC

## 2018-01-13 MED FILL — DONEPEZIL HCL 10 MG TABLET: 10 | 37 days supply | Qty: 30 | Fill #0
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
## 2018-01-13 NOTE — Patient Instructions (Signed)
1. Start Donepezil 10mg : Take 1/2 tablet daily for 2 weeks, then increase to 1 tablet daily  2. Recommend having family check behind you with medications  3. If mood issues worsen, discuss with Dr. Felipa Eth  4. Follow-up in 6 months, call for any changes  FALL PRECAUTIONS: Be cautious when walking. Scan the area for obstacles that may increase the risk of trips and falls. When getting up in the mornings, sit up at the edge of the bed for a few minutes before getting out of bed. Consider elevating the bed at the head end to avoid drop of blood pressure when getting up. Walk always in a well-lit room (use night lights in the walls). Avoid area rugs or power cords from appliances in the middle of the walkways. Use a walker or a cane if necessary and consider physical therapy for balance exercise. Get your eyesight checked regularly.  FINANCIAL OVERSIGHT: Supervision, especially oversight when making financial decisions or transactions is also recommended.  HOME SAFETY: Consider the safety of the kitchen when operating appliances like stoves, microwave oven, and blender. Consider having supervision and share cooking responsibilities until no longer able to participate in those. Accidents with firearms and other hazards in the house should be identified and addressed as well.  ABILITY TO BE LEFT ALONE: If patient is unable to contact 911 operator, consider using LifeLine, or when the need is there, arrange for someone to stay with patients. Smoking is a fire hazard, consider supervision or cessation. Risk of wandering should be assessed by caregiver and if detected at any point, supervision and safe proof recommendations should be instituted.  MEDICATION SUPERVISION: Inability to self-administer medication needs to be constantly addressed. Implement a mechanism to ensure safe administration of the medications.  RECOMMENDATIONS FOR ALL PATIENTS WITH MEMORY PROBLEMS: 1. Continue to exercise (Recommend  30 minutes of walking everyday, or 3 hours every week) 2. Increase social interactions - continue going to [place] and enjoy social gatherings with friends and family 3. Eat healthy, avoid fried foods and eat more fruits and vegetables 4. Maintain adequate blood pressure, blood sugar, and blood cholesterol level. Reducing the risk of stroke and cardiovascular disease also helps promoting better memory. 5. Avoid stressful situations. Live a simple life and avoid aggravations. Organize your time and prepare for the next day in anticipation. 6. Sleep well, avoid any interruptions of sleep and avoid any distractions in the bedroom that may interfere with adequate sleep quality 7. Avoid sugar, avoid sweets as there is a strong link between excessive sugar intake, diabetes, and cognitive impairment The Mediterranean diet has been shown to help patients reduce the risk of progressive memory disorders and reduces cardiovascular risk. This includes eating fish, eat fruits and green leafy vegetables, nuts like almonds and hazelnuts, walnuts, and also use olive oil. Avoid fast foods and fried foods as much as possible. Avoid sweets and sugar as sugar use has been linked to worsening of memory function.  There is always a concern of gradual progression of memory problems. If this is the case, then we may need to adjust level of care according to patient needs. Support, both to the patient and caregiver, should then be put into place.

## 2018-01-17 MED FILL — ESCITALOPRAM 20 MG TABLET: 20 | 30 days supply | Qty: 30 | Fill #0

## 2018-02-13 MED FILL — ESCITALOPRAM 20 MG TABLET: 20 | 30 days supply | Qty: 30 | Fill #1

## 2018-02-13 MED FILL — METOPROLOL SUCCINATE ER 25: 25 | 30 days supply | Qty: 30 | Fill #1

## 2018-03-06 MED FILL — IMIQUIMOD 5% CREAM PACKET: 5 | 42 days supply | Qty: 6 | Fill #0

## 2018-03-08 MED FILL — METOPROLOL SUCCINATE ER 25: 25 | 30 days supply | Qty: 30 | Fill #2

## 2018-03-08 MED FILL — ESCITALOPRAM 20 MG TABLET: 20 | 30 days supply | Qty: 30 | Fill #2

## 2018-03-21 MED FILL — RAMIPRIL 10 MG CAPSULE: 10 | 90 days supply | Qty: 90 | Fill #1 | Status: TO

## 2018-04-03 MED FILL — ESCITALOPRAM 20 MG TABLET: 20 | 30 days supply | Qty: 30 | Fill #3 | Status: TO

## 2018-04-03 MED FILL — METOPROLOL SUCCINATE ER 25: 25 | 30 days supply | Qty: 30 | Fill #3 | Status: TO

## 2018-05-08 MED FILL — METOPROLOL SUCCINATE ER 25: 25 | 30 days supply | Qty: 30 | Fill #0

## 2018-05-08 MED FILL — ESCITALOPRAM 20 MG TABLET: 20 | 30 days supply | Qty: 30 | Fill #0

## 2018-06-05 MED FILL — METOPROLOL SUCCINATE ER 25: 25 | 30 days supply | Qty: 30 | Fill #1 | Status: TO

## 2018-06-05 MED FILL — ESCITALOPRAM 20 MG TABLET: 20 | 30 days supply | Qty: 30 | Fill #1 | Status: TO

## 2018-06-05 MED FILL — RAMIPRIL 10 MG CAPSULE: 10 | 90 days supply | Qty: 90 | Fill #0

## 2018-07-10 MED FILL — ESCITALOPRAM 20 MG TABLET: 20 | 30 days supply | Qty: 30 | Fill #0

## 2018-07-10 MED FILL — METOPROLOL SUCCINATE ER 25: 25 | 30 days supply | Qty: 30 | Fill #0

## 2018-08-03 MED FILL — METOPROLOL SUCCINATE ER 25: 25 | 30 days supply | Qty: 30 | Fill #1

## 2018-08-03 MED FILL — ESCITALOPRAM 20 MG TABLET: 20 | 30 days supply | Qty: 30 | Fill #1

## 2018-08-21 ENCOUNTER — Telehealth (INDEPENDENT_AMBULATORY_CARE_PROVIDER_SITE_OTHER): Payer: Medicare Other | Admitting: Neurology

## 2018-08-21 ENCOUNTER — Other Ambulatory Visit: Payer: Self-pay

## 2018-08-21 ENCOUNTER — Encounter: Payer: Self-pay | Admitting: Neurology

## 2018-08-21 VITALS — Ht 62.0 in | Wt 165.0 lb

## 2018-08-21 DIAGNOSIS — F0390 Unspecified dementia without behavioral disturbance: Secondary | ICD-10-CM

## 2018-08-21 DIAGNOSIS — F03A0 Unspecified dementia, mild, without behavioral disturbance, psychotic disturbance, mood disturbance, and anxiety: Secondary | ICD-10-CM

## 2018-08-21 MED ORDER — BUSPIRONE HCL 15 MG PO TABS: ORAL_TABLET | ORAL | 11 refills | Status: DC

## 2018-08-21 MED FILL — busPIRone HCL 15 MG TABS: 15 | 30 days supply | Qty: 30 | Fill #0
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
## 2018-09-13 MED FILL — RAMIPRIL 10 MG CAPSULE: 10 | 8 days supply | Qty: 8 | Fill #0

## 2018-09-13 MED FILL — busPIRone HCL 15 MG TABS: 15 | 8 days supply | Qty: 8 | Fill #1

## 2018-09-14 ENCOUNTER — Other Ambulatory Visit: Payer: Self-pay

## 2018-09-14 ENCOUNTER — Ambulatory Visit
Admission: RE | Admit: 2018-09-14 | Discharge: 2018-09-14 | Disposition: A | Discharge status: Home / Self Care | Payer: Medicare Other | Source: Ambulatory Visit | Attending: Geriatric Medicine | Admitting: Geriatric Medicine

## 2018-09-14 ENCOUNTER — Telehealth: Payer: Self-pay | Admitting: Neurology

## 2018-09-14 ENCOUNTER — Other Ambulatory Visit: Payer: Self-pay | Admitting: Geriatric Medicine

## 2018-09-14 DIAGNOSIS — M79644 Pain in right finger(s): Secondary | ICD-10-CM

## 2018-09-14 MED ORDER — BUSPIRONE HCL 15 MG PO TABS: ORAL_TABLET | ORAL | 6 refills | Status: DC

## 2018-09-14 NOTE — Telephone Encounter (Signed)
Needing new prescription for the Busprione medication to the [facility] fax #: [phone]. Thanks!

## 2018-09-14 NOTE — Telephone Encounter (Signed)
Buspar Erx to [facility] with 6 refills

## 2018-09-14 NOTE — Telephone Encounter (Signed)
Now Lovena Le from [facility] left msg with after hours about patient taking double to dose of medication and being out of meds for the Busiprone. She is out of meds and needing a refill. If you nrrf yo call Lovena Le it's [phone]. Thanks!

## 2019-03-30 ENCOUNTER — Ambulatory Visit: Payer: Medicare Other | Admitting: Neurology

## 2019-04-04 ENCOUNTER — Other Ambulatory Visit: Payer: Self-pay | Admitting: Neurology

## 2019-04-04 ENCOUNTER — Other Ambulatory Visit: Payer: Self-pay

## 2019-04-04 MED ORDER — BUSPIRONE HCL 15 MG PO TABS: ORAL_TABLET | ORAL | 6 refills | Status: AC

## 2019-04-04 NOTE — Telephone Encounter (Signed)
Needing to get a refill on Buspirone 15 MG. Please Call. Thank you

## 2019-04-04 NOTE — Telephone Encounter (Signed)
Medication was [facility] to [facility], tried to call pt back, the phone number wasn't taken calls at this time,

## 2020-01-01 ENCOUNTER — Ambulatory Visit: Payer: Medicare Other | Attending: Internal Medicine

## 2020-01-01 DIAGNOSIS — Z23 Encounter for immunization: Secondary | ICD-10-CM

## 2020-01-01 NOTE — Progress Notes (Signed)
   Covid-19 Vaccination Clinic  Name:  Tasha Tran    MRN: [id number] DOB: [birth year]  01/01/2020  [name] was observed post Covid-19 immunization for 15 minutes without incident. She was provided with Vaccine Information Sheet and instruction to access the V-Safe system.   [name] was instructed to call 911 with any severe reactions post vaccine: Marland Kitchen Difficulty breathing  . Swelling of face and throat  . A fast heartbeat  . A bad rash all over body  . Dizziness and weakness   Immunizations Administered    Name Date Dose VIS Date Route   Pfizer COVID-19 Vaccine 01/01/2020  2:35 PM 0.3 mL 11/21/2019 Intramuscular   Manufacturer: [place]   Lot: [id number]   [place]: [address]-5

## 2020-02-07 DIAGNOSIS — I129 Hypertensive chronic kidney disease with stage 1 through stage 4 chronic kidney disease, or unspecified chronic kidney disease: Secondary | ICD-10-CM | POA: Diagnosis not present

## 2020-02-07 DIAGNOSIS — N1831 Chronic kidney disease, stage 3a: Secondary | ICD-10-CM | POA: Diagnosis not present

## 2020-02-07 DIAGNOSIS — G301 Alzheimer's disease with late onset: Secondary | ICD-10-CM | POA: Diagnosis not present

## 2020-03-31 DIAGNOSIS — G301 Alzheimer's disease with late onset: Secondary | ICD-10-CM | POA: Diagnosis not present

## 2020-03-31 DIAGNOSIS — I129 Hypertensive chronic kidney disease with stage 1 through stage 4 chronic kidney disease, or unspecified chronic kidney disease: Secondary | ICD-10-CM | POA: Diagnosis not present

## 2020-03-31 DIAGNOSIS — N1831 Chronic kidney disease, stage 3a: Secondary | ICD-10-CM | POA: Diagnosis not present

## 2020-04-04 DIAGNOSIS — N1831 Chronic kidney disease, stage 3a: Secondary | ICD-10-CM | POA: Diagnosis not present

## 2020-04-04 DIAGNOSIS — I129 Hypertensive chronic kidney disease with stage 1 through stage 4 chronic kidney disease, or unspecified chronic kidney disease: Secondary | ICD-10-CM | POA: Diagnosis not present

## 2020-04-04 DIAGNOSIS — G301 Alzheimer's disease with late onset: Secondary | ICD-10-CM | POA: Diagnosis not present

## 2020-05-05 DIAGNOSIS — G301 Alzheimer's disease with late onset: Secondary | ICD-10-CM | POA: Diagnosis not present

## 2020-05-05 DIAGNOSIS — N1831 Chronic kidney disease, stage 3a: Secondary | ICD-10-CM | POA: Diagnosis not present

## 2020-05-05 DIAGNOSIS — I129 Hypertensive chronic kidney disease with stage 1 through stage 4 chronic kidney disease, or unspecified chronic kidney disease: Secondary | ICD-10-CM | POA: Diagnosis not present

## 2020-06-04 DIAGNOSIS — G301 Alzheimer's disease with late onset: Secondary | ICD-10-CM | POA: Diagnosis not present

## 2020-06-04 DIAGNOSIS — N1831 Chronic kidney disease, stage 3a: Secondary | ICD-10-CM | POA: Diagnosis not present

## 2020-06-04 DIAGNOSIS — I129 Hypertensive chronic kidney disease with stage 1 through stage 4 chronic kidney disease, or unspecified chronic kidney disease: Secondary | ICD-10-CM | POA: Diagnosis not present

## 2020-06-10 DIAGNOSIS — L858 Other specified epidermal thickening: Secondary | ICD-10-CM | POA: Diagnosis not present

## 2020-06-10 DIAGNOSIS — Z0000 Encounter for general adult medical examination without abnormal findings: Secondary | ICD-10-CM | POA: Diagnosis not present

## 2020-06-10 DIAGNOSIS — N1831 Chronic kidney disease, stage 3a: Secondary | ICD-10-CM | POA: Diagnosis not present

## 2020-06-10 DIAGNOSIS — G301 Alzheimer's disease with late onset: Secondary | ICD-10-CM | POA: Diagnosis not present

## 2020-06-10 DIAGNOSIS — I129 Hypertensive chronic kidney disease with stage 1 through stage 4 chronic kidney disease, or unspecified chronic kidney disease: Secondary | ICD-10-CM | POA: Diagnosis not present

## 2020-06-10 DIAGNOSIS — Z79899 Other long term (current) drug therapy: Secondary | ICD-10-CM | POA: Diagnosis not present

## 2020-07-11 DIAGNOSIS — N1831 Chronic kidney disease, stage 3a: Secondary | ICD-10-CM | POA: Diagnosis not present

## 2020-07-30 ENCOUNTER — Ambulatory Visit: Payer: Medicare Other | Admitting: Physician Assistant

## 2020-07-30 ENCOUNTER — Encounter: Payer: Self-pay | Admitting: Physician Assistant

## 2020-07-30 ENCOUNTER — Other Ambulatory Visit: Payer: Self-pay

## 2020-07-30 ENCOUNTER — Other Ambulatory Visit (HOSPITAL_COMMUNITY): Payer: Self-pay

## 2020-07-30 DIAGNOSIS — L080 Pyoderma: Secondary | ICD-10-CM | POA: Diagnosis not present

## 2020-07-30 DIAGNOSIS — A499 Bacterial infection, unspecified: Secondary | ICD-10-CM

## 2020-07-30 DIAGNOSIS — G301 Alzheimer's disease with late onset: Secondary | ICD-10-CM | POA: Diagnosis not present

## 2020-07-30 DIAGNOSIS — L089 Local infection of the skin and subcutaneous tissue, unspecified: Secondary | ICD-10-CM

## 2020-07-30 DIAGNOSIS — I129 Hypertensive chronic kidney disease with stage 1 through stage 4 chronic kidney disease, or unspecified chronic kidney disease: Secondary | ICD-10-CM | POA: Diagnosis not present

## 2020-07-30 DIAGNOSIS — N1831 Chronic kidney disease, stage 3a: Secondary | ICD-10-CM | POA: Diagnosis not present

## 2020-07-30 MED ORDER — KETOCONAZOLE 2 % EX CREA
1.0000 "application " | TOPICAL_CREAM | Freq: Every evening | CUTANEOUS | 0 refills | Status: AC
  Filled 2020-07-30: qty 60, 30d supply, fill #0

## 2020-07-30 MED ORDER — SILVER SULFADIAZINE 1 % EX CREA
1.0000 "application " | TOPICAL_CREAM | Freq: Every day | CUTANEOUS | 0 refills | Status: DC
  Filled 2020-07-30: qty 50, 20d supply, fill #0

## 2020-07-30 NOTE — Progress Notes (Signed)
   Follow-Up Visit   Subjective  Tasha Tran is a 87 y.o. female who presents for the following: Skin Problem (Patient has a rash around neck/chest area and is moving down her arms. X 2 months ago. Some itch/sensitivity. Patient will scratch and it will bleed.  Patient has used an old steroid cream and neosporin anti itch cream. ).   The following portions of the chart were reviewed this encounter and updated as appropriate:  Tobacco  Allergies  Meds  Problems  Med Hx  Surg Hx  Fam Hx       Objective  Well appearing patient in no apparent distress; mood and affect are within normal limits.  All skin waist up examined.  Chest - Medial [facility])      Assessment & Plan  Pyoderma Chest - Medial (Center)  silver sulfADIAZINE (SILVADENE) 1 % cream - Chest - Medial (Center) Apply 1 application topically daily.  Anaerobic and Aerobic Culture - Chest - Medial (Center)  Related Medications ketoconazole (NIZORAL) 2 % cream Apply 1 application topically at bedtime.  Infection of skin and subcutaneous tissue  Bacterial infection  Related Procedures Anaerobic and Aerobic Culture  She has a thick horn on her left hand that needs to be removed. Has an appointment on the 17th with Dr. Denna Haggard.  I, Kaleeya Hancock, PA-C, have reviewed all documentation's for this visit.  The documentation on 07/30/20 for the exam, diagnosis, procedures and orders are all accurate and complete.

## 2020-08-05 LAB — ANAEROBIC AND AEROBIC CULTURE
MICRO NUMBER:: 12064851
MICRO NUMBER:: 12064852
SPECIMEN QUALITY:: ADEQUATE
SPECIMEN QUALITY:: ADEQUATE

## 2020-08-11 ENCOUNTER — Telehealth: Payer: Self-pay | Admitting: Dermatology

## 2020-08-11 NOTE — Telephone Encounter (Signed)
Patient's daughter is calling for pathology results from last visit with Lavonna Monarch, MD.

## 2020-08-12 ENCOUNTER — Other Ambulatory Visit (HOSPITAL_COMMUNITY): Payer: Self-pay

## 2020-08-12 ENCOUNTER — Other Ambulatory Visit: Payer: Self-pay | Admitting: Physician Assistant

## 2020-08-12 DIAGNOSIS — L080 Pyoderma: Secondary | ICD-10-CM

## 2020-08-12 MED ORDER — SILVER SULFADIAZINE 1 % EX CREA
1.0000 | TOPICAL_CREAM | Freq: Every day | CUTANEOUS | 0 refills | Status: AC
Start: 2020-08-12 — End: ?
  Filled 2020-08-12: qty 50, 50d supply, fill #0
  Filled 2020-08-12: qty 50, 15d supply, fill #0

## 2020-08-12 NOTE — Telephone Encounter (Signed)
Phone call to patient and spoke to daughter Kit about patients chest. She is doing better and will see Dr. Denna Haggard next week. They will call with any concerns.

## 2020-08-14 ENCOUNTER — Other Ambulatory Visit (HOSPITAL_COMMUNITY): Payer: Self-pay

## 2020-08-19 ENCOUNTER — Ambulatory Visit: Payer: Medicare Other | Admitting: Dermatology

## 2020-08-19 ENCOUNTER — Other Ambulatory Visit: Payer: Self-pay

## 2020-08-19 ENCOUNTER — Other Ambulatory Visit (HOSPITAL_COMMUNITY): Payer: Self-pay

## 2020-08-19 DIAGNOSIS — R21 Rash and other nonspecific skin eruption: Secondary | ICD-10-CM | POA: Diagnosis not present

## 2020-08-19 DIAGNOSIS — C44629 Squamous cell carcinoma of skin of left upper limb, including shoulder: Secondary | ICD-10-CM

## 2020-08-19 DIAGNOSIS — D485 Neoplasm of uncertain behavior of skin: Secondary | ICD-10-CM

## 2020-08-19 MED ORDER — TRIAMCINOLONE ACETONIDE 0.1 % EX CREA
1.0000 "application " | TOPICAL_CREAM | Freq: Every day | CUTANEOUS | 2 refills | Status: AC | PRN
  Filled 2020-08-19: qty 454, 30d supply, fill #0

## 2020-08-19 NOTE — Patient Instructions (Signed)

## 2020-08-28 DIAGNOSIS — G301 Alzheimer's disease with late onset: Secondary | ICD-10-CM | POA: Diagnosis not present

## 2020-08-28 DIAGNOSIS — N1831 Chronic kidney disease, stage 3a: Secondary | ICD-10-CM | POA: Diagnosis not present

## 2020-08-28 DIAGNOSIS — I129 Hypertensive chronic kidney disease with stage 1 through stage 4 chronic kidney disease, or unspecified chronic kidney disease: Secondary | ICD-10-CM | POA: Diagnosis not present

## 2020-08-28 NOTE — Progress Notes (Signed)
    Follow-Up Visit   Subjective  Tasha Tran is a 87 y.o. female who presents for the following: Follow-up (Follow up on rash on chest. Used Silvadene on chest. It is some better. There is lesion on left hand that needs removal. ).  Recheck rash on anterior torso plus rapid growth on left hand that is painful Location:  Duration:  Quality:  Associated Signs/Symptoms: Modifying Factors:  Severity:  Timing: Context:   Objective  Well appearing patient in no apparent distress; mood and affect are within normal limits. Left Hand - Posterior Very thick exophytic white crust, probable SCCA.     Chest - Medial [facility]), Left Breast, Left Inframammary Fold, Right Breast, Right Inframammary Fold Lichenoid dermatitis is actually gotten 80% better on upper chest but there is some more erythematous micropapular dermatitis extending to lower sternum and frontal rib cage area.  There are no pustules, vesicles or satellite lesions.  Differential includes systemic allergic reaction, atypical Grovers disease, adult papular eczema.  Discussed with family the probability that extensive evaluation would not delineate actual cause.    A focused examination was performed including head, neck, chest, abdomen, hands.. Relevant physical exam findings are noted in the Assessment and Plan.   Assessment & Plan    Neoplasm of uncertain behavior of skin Left Hand - Posterior  Skin / nail biopsy Type of biopsy: tangential   Informed consent: discussed and consent obtained   Timeout: patient name, date of birth, surgical site, and procedure verified   Anesthesia: the lesion was anesthetized in a standard fashion   Anesthetic:  1% lidocaine w/ epinephrine 1-100,000 local infiltration Instrument used: flexible razor blade   Hemostasis achieved with: ferric subsulfate   Outcome: patient tolerated procedure well   Post-procedure details: wound care instructions given    Destruction of  lesion Complexity: simple   Destruction method: electrodesiccation and curettage   Informed consent: discussed and consent obtained   Timeout:  patient name, date of birth, surgical site, and procedure verified Anesthesia: the lesion was anesthetized in a standard fashion   Anesthetic:  1% lidocaine w/ epinephrine 1-100,000 local infiltration Curettage performed in three different directions: Yes   Curettage cycles:  1 Lesion length (cm):  1.1 Lesion width (cm):  1.1 Margin per side (cm):  0 Final wound size (cm):  1.1 Hemostasis achieved with:  aluminum chloride Outcome: patient tolerated procedure well with no complications   Post-procedure details: wound care instructions given    Specimen 1 - Surgical pathology Differential Diagnosis: KA  Check Margins: No  After shave biopsy the visible deep keratin was curetted and cauterized.  Rash and other nonspecific skin eruption Chest - Medial [facility]); Left Breast; Right Breast; Left Inframammary Fold; Right Inframammary Fold  We will switch topical to a low to mid potency anti-inflammatory (triamcinolone).  Apply 1-2 times daily to visible inflamed rash for the next 2 to 4 weeks.  Follow-up by MyChart or phone at that time.  Follow up in couple weeks in 2 weeks phone call or mychart message.    triamcinolone cream (KENALOG) 0.1 % - Chest - Medial [facility]), Left Breast, Left Inframammary Fold, Right Breast, Right Inframammary Fold Apply 1 application topically daily as needed.      I, Lavonna Monarch, MD, have reviewed all documentation for this visit.  The documentation on 08/28/20 for the exam, diagnosis, procedures, and orders are all accurate and complete.

## 2020-09-10 ENCOUNTER — Encounter: Payer: Self-pay | Admitting: Dermatology

## 2020-09-10 DIAGNOSIS — N1831 Chronic kidney disease, stage 3a: Secondary | ICD-10-CM | POA: Diagnosis not present

## 2020-09-10 DIAGNOSIS — I129 Hypertensive chronic kidney disease with stage 1 through stage 4 chronic kidney disease, or unspecified chronic kidney disease: Secondary | ICD-10-CM | POA: Diagnosis not present

## 2020-09-26 DIAGNOSIS — G301 Alzheimer's disease with late onset: Secondary | ICD-10-CM | POA: Diagnosis not present

## 2020-09-26 DIAGNOSIS — I129 Hypertensive chronic kidney disease with stage 1 through stage 4 chronic kidney disease, or unspecified chronic kidney disease: Secondary | ICD-10-CM | POA: Diagnosis not present

## 2020-09-26 DIAGNOSIS — N1831 Chronic kidney disease, stage 3a: Secondary | ICD-10-CM | POA: Diagnosis not present

## 2020-10-28 DIAGNOSIS — N1831 Chronic kidney disease, stage 3a: Secondary | ICD-10-CM | POA: Diagnosis not present

## 2020-10-28 DIAGNOSIS — G301 Alzheimer's disease with late onset: Secondary | ICD-10-CM | POA: Diagnosis not present

## 2020-10-28 DIAGNOSIS — I129 Hypertensive chronic kidney disease with stage 1 through stage 4 chronic kidney disease, or unspecified chronic kidney disease: Secondary | ICD-10-CM | POA: Diagnosis not present

## 2020-11-10 DIAGNOSIS — N1831 Chronic kidney disease, stage 3a: Secondary | ICD-10-CM | POA: Diagnosis not present

## 2020-11-10 DIAGNOSIS — I129 Hypertensive chronic kidney disease with stage 1 through stage 4 chronic kidney disease, or unspecified chronic kidney disease: Secondary | ICD-10-CM | POA: Diagnosis not present

## 2020-11-10 DIAGNOSIS — Z23 Encounter for immunization: Secondary | ICD-10-CM | POA: Diagnosis not present

## 2020-11-20 ENCOUNTER — Emergency Department (HOSPITAL_BASED_OUTPATIENT_CLINIC_OR_DEPARTMENT_OTHER): Payer: Medicare Other

## 2020-11-20 ENCOUNTER — Encounter (HOSPITAL_BASED_OUTPATIENT_CLINIC_OR_DEPARTMENT_OTHER): Payer: Self-pay | Admitting: Emergency Medicine

## 2020-11-20 ENCOUNTER — Emergency Department (HOSPITAL_BASED_OUTPATIENT_CLINIC_OR_DEPARTMENT_OTHER)
Admission: EM | Admit: 2020-11-20 | Discharge: 2020-11-20 | Disposition: A | Discharge status: Home / Self Care | Payer: Medicare Other | Attending: Emergency Medicine | Admitting: Emergency Medicine

## 2020-11-20 ENCOUNTER — Other Ambulatory Visit: Payer: Self-pay

## 2020-11-20 DIAGNOSIS — R140 Abdominal distension (gaseous): Secondary | ICD-10-CM | POA: Diagnosis not present

## 2020-11-20 DIAGNOSIS — I10 Essential (primary) hypertension: Secondary | ICD-10-CM | POA: Insufficient documentation

## 2020-11-20 DIAGNOSIS — Z87891 Personal history of nicotine dependence: Secondary | ICD-10-CM | POA: Diagnosis not present

## 2020-11-20 DIAGNOSIS — R1084 Generalized abdominal pain: Secondary | ICD-10-CM | POA: Diagnosis not present

## 2020-11-20 DIAGNOSIS — K219 Gastro-esophageal reflux disease without esophagitis: Secondary | ICD-10-CM | POA: Diagnosis not present

## 2020-11-20 DIAGNOSIS — K8020 Calculus of gallbladder without cholecystitis without obstruction: Secondary | ICD-10-CM | POA: Diagnosis not present

## 2020-11-20 DIAGNOSIS — Z79899 Other long term (current) drug therapy: Secondary | ICD-10-CM | POA: Insufficient documentation

## 2020-11-20 DIAGNOSIS — I517 Cardiomegaly: Secondary | ICD-10-CM | POA: Diagnosis not present

## 2020-11-20 DIAGNOSIS — R197 Diarrhea, unspecified: Secondary | ICD-10-CM | POA: Diagnosis not present

## 2020-11-20 DIAGNOSIS — R109 Unspecified abdominal pain: Secondary | ICD-10-CM

## 2020-11-20 DIAGNOSIS — R195 Other fecal abnormalities: Secondary | ICD-10-CM

## 2020-11-20 LAB — HEPATIC FUNCTION PANEL
ALT: 9 U/L (ref 0–44)
AST: 15 U/L (ref 15–41)
Albumin: 4 g/dL (ref 3.5–5.0)
Alkaline Phosphatase: 57 U/L (ref 38–126)
Bilirubin, Direct: 0.1 mg/dL (ref 0.0–0.2)
Indirect Bilirubin: 0.4 mg/dL (ref 0.3–0.9)
Total Bilirubin: 0.5 mg/dL (ref 0.3–1.2)
Total Protein: 7 g/dL (ref 6.5–8.1)

## 2020-11-20 LAB — CBC WITH DIFFERENTIAL/PLATELET
Abs Immature Granulocytes: 0.05 10*3/uL (ref 0.00–0.07)
Basophils Absolute: 0 10*3/uL (ref 0.0–0.1)
Basophils Relative: 1 %
Eosinophils Absolute: 0 10*3/uL (ref 0.0–0.5)
Eosinophils Relative: 0 %
HCT: 36.9 % (ref 36.0–46.0)
Hemoglobin: 12.5 g/dL (ref 12.0–15.0)
Immature Granulocytes: 1 %
Lymphocytes Relative: 13 %
Lymphs Abs: 1.1 10*3/uL (ref 0.7–4.0)
MCH: 31.7 pg (ref 26.0–34.0)
MCHC: 33.9 g/dL (ref 30.0–36.0)
MCV: 93.7 fL (ref 80.0–100.0)
Monocytes Absolute: 0.7 10*3/uL (ref 0.1–1.0)
Monocytes Relative: 8 %
Neutro Abs: 6.7 10*3/uL (ref 1.7–7.7)
Neutrophils Relative %: 77 %
Platelets: 165 10*3/uL (ref 150–400)
RBC: 3.94 MIL/uL (ref 3.87–5.11)
RDW: 13.7 % (ref 11.5–15.5)
WBC: 8.5 10*3/uL (ref 4.0–10.5)
nRBC: 0 % (ref 0.0–0.2)

## 2020-11-20 LAB — BASIC METABOLIC PANEL
Anion gap: 11 (ref 5–15)
BUN: 18 mg/dL (ref 8–23)
CO2: 26 mmol/L (ref 22–32)
Calcium: 9.4 mg/dL (ref 8.9–10.3)
Chloride: 101 mmol/L (ref 98–111)
Creatinine, Ser: 1.09 mg/dL — ABNORMAL HIGH (ref 0.44–1.00)
GFR, Estimated: 49 mL/min — ABNORMAL LOW (ref >60)
Glucose, Bld: 110 mg/dL — ABNORMAL HIGH (ref 70–99)
Potassium: 3.4 mmol/L — ABNORMAL LOW (ref 3.5–5.1)
Sodium: 138 mmol/L (ref 135–145)

## 2020-11-20 LAB — LIPASE, BLOOD: Lipase: 25 U/L (ref 11–51)

## 2020-11-20 MED ORDER — POTASSIUM CHLORIDE CRYS ER 20 MEQ PO TBCR
20.0000 meq | EXTENDED_RELEASE_TABLET | Freq: Once | ORAL | Status: AC
  Administered 2020-11-20: 20 meq via ORAL
  Filled 2020-11-20: qty 1

## 2020-11-20 NOTE — ED Triage Notes (Signed)
Brought by daughter.  Reports her father was concerned that patient is having abdominal pain.  Patient is demented and currently denies having anything wrong with her.  They were also concerned about elevated BP.

## 2020-11-20 NOTE — ED Provider Notes (Signed)
[facility] EMERGENCY DEPT Provider Note   CSN: [id number] Arrival date & time: 11/20/20  1525     History Chief Complaint  Patient presents with   Abdominal Pain   Hypertension    Tasha Tran is a 87 y.o. female.   Abdominal Pain Hypertension Associated symptoms include abdominal pain.     87 year old female with medical history below presenting to the emergency department with generalized abdominal discomfort.  The history is provided by the patient's daughter as the patient is demented.  Level 5 caveat due to patient dementia.  The patient has been pointing to her abdomen saying "ouch ouch" and her daughter thought it possibly was on the right side but she is not sure.  She had 1 episode of loose nonbloody stools earlier today.  She is otherwise tolerating oral intake.  The patient denies any complaints at this time.  Additionally, they were concerned because the patient was hypertensive at home.  No report of change in mental status from her baseline, change in vision, chest pain or shortness of breath.  The patient has been taking her home antihypertensives to include metoprolol and ramipril. Past Medical History:  Diagnosis Date   Arthritis    GERD (gastroesophageal reflux disease)    occasional   History of DVT of lower extremity    02-13-2002  post surgery -  bilateral lower extremitiy   Hypertension    Left ureteral stone    Nephrolithiasis    LEFT    Rash    resolving from [place] on forearms/ legs   Wears glasses     Patient Active Problem List   Diagnosis Date Noted   Lumbar stenosis 04/14/2011   Thoracic or lumbosacral neuritis or radiculitis, unspecified 04/01/2011    Past Surgical History:  Procedure Laterality Date   CYSTOSCOPY WITH RETROGRADE PYELOGRAM, URETEROSCOPY AND STENT PLACEMENT Left 07/03/2015   Procedure: CYSTOSCOPY WITH LEFT RETROGRADE PYELOGRAM, LEFT URETEROSCOPY, LASER LITHOTRIPSY, STONE BASKET EXTRACTION AND  DOUBLE J STENT PLACEMENT;  Surgeon: Ardis Hughs, MD;  Location: [facility];  Service: Urology;  Laterality: Left;   EXTRACORPOREAL SHOCK WAVE LITHOTRIPSY  last one 06-24-2014   HOLMIUM LASER APPLICATION Left 02/02/9415   Procedure: HOLMIUM LASER APPLICATION;  Surgeon: Ardis Hughs, MD;  Location: [facility];  Service: Urology;  Laterality: Left;   ORIFT LEFT ANKLE FX  01-19-2002   TUBAL LIGATION  1970'S     OB History   No obstetric history on file.     No family history on file.  Social History   Tobacco Use   Smoking status: Former    Packs/day: 0.50    Years: 30.00    Pack years: 15.00    Types: Cigarettes    Quit date: 06/27/1978    Years since quitting: 42.4   Smokeless tobacco: Never  Vaping Use   Vaping Use: Never used  Substance Use Topics   Alcohol use: Yes    Comment: socially   Drug use: No    Home Medications Prior to Admission medications   Medication Sig Start Date End Date Taking? Authorizing Provider  busPIRone (BUSPAR) 15 MG tablet Take 1 tablet every morning 04/04/19   Cameron Sprang, MD  escitalopram (LEXAPRO) 10 MG tablet TK 1 T PO QD 05/29/17   [provider]  ketoconazole (NIZORAL) 2 % cream Apply 1 application topically at bedtime. 07/30/20   Sheffield, Vida Roller R, PA-C  metoprolol succinate (TOPROL-XL) 50 MG  24 hr tablet Take 50 mg by mouth every morning. Take with or immediately following a meal.    [provider]  ramipril (ALTACE) 10 MG tablet Take 10 mg by mouth every morning.     [provider]  silver sulfADIAZINE (SSD) 1 % cream Apply 1 application topically daily. 08/12/20   Robyne Askew R, PA-C  triamcinolone cream (KENALOG) 0.1 % Apply 1 application topically daily as needed. 08/19/20   Lavonna Monarch, MD  gabapentin (NEURONTIN) 100 MG capsule Take 100 mg by mouth 3 (three) times daily.  04/14/11  [provider]    Allergies    Patient has no known  allergies.  Review of Systems   Review of Systems  Unable to perform ROS: Dementia  Gastrointestinal:  Positive for abdominal pain.   Physical Exam Updated Vital Signs BP (!) 193/75 (BP Location: Right Arm)   Pulse 78   Temp 98.1 F (36.7 C)   Resp 14   Ht 5\' 2"  (1.575 m)   Wt 74.8 kg   SpO2 97%   BMI 30.16 kg/m   Physical Exam Vitals and nursing note reviewed.  Constitutional:      General: She is not in acute distress. HENT:     Head: Normocephalic and atraumatic.  Eyes:     Conjunctiva/sclera: Conjunctivae normal.     Pupils: Pupils are equal, round, and reactive to light.  Cardiovascular:     Rate and Rhythm: Normal rate and regular rhythm.  Pulmonary:     Effort: Pulmonary effort is normal. No respiratory distress.     Breath sounds: Normal breath sounds.  Abdominal:     General: There is no distension.     Tenderness: There is no abdominal tenderness. There is no guarding or rebound.  Musculoskeletal:        General: No deformity or signs of injury.     Cervical back: Normal range of motion and neck supple.  Skin:    Findings: No lesion or rash.  Neurological:     General: No focal deficit present.     Mental Status: She is alert. Mental status is at baseline.    ED Results / Procedures / Treatments   Labs (all labs ordered are listed, but only abnormal results are displayed) Labs Reviewed  BASIC METABOLIC PANEL - Abnormal; Notable for the following components:      Result Value   Potassium 3.4 (*)    Glucose, Bld 110 (*)    Creatinine, Ser 1.09 (*)    GFR, Estimated 49 (*)    All other components within normal limits  CBC WITH DIFFERENTIAL/PLATELET  HEPATIC FUNCTION PANEL  LIPASE, BLOOD    EKG EKG Interpretation  Date/Time:  Thursday November 20 2020 16:23:05 EDT Ventricular Rate:  83 PR Interval:  185 QRS Duration: 133 QT Interval:  528 QTC Calculation: 537 R Axis:   -62 Text Interpretation: Sinus rhythm Multiple premature complexes,  vent & supraven RBBB and LAFB Confirmed by Regan Lemming ([id number]) on 11/20/2020 4:57:40 PM  Radiology DG Abdomen 1 View  Result Date: 11/20/2020 CLINICAL DATA:  Abdominal discomfort EXAM: ABDOMEN - 1 VIEW COMPARISON:  06/05/2015 FINDINGS: Multiple calcified gallstones noted. No organomegaly, bowel obstruction or free air. No acute bony abnormality. Degenerative changes in the lumbar spine and hips. IMPRESSION: Cholelithiasis. No acute findings. Electronically Signed   By: Rolm Baptise M.D.   On: 11/20/2020 17:15   DG Chest Portable 1 View  Result Date: 11/20/2020 CLINICAL DATA:  Hypertension  EXAM: PORTABLE CHEST 1 VIEW COMPARISON:  None. FINDINGS: Cardiomegaly. Tortuous aorta with calcifications. Lungs are clear. No effusions or edema. No acute bony abnormality. IMPRESSION: Cardiomegaly.  No active disease. Electronically Signed   By: Rolm Baptise M.D.   On: 11/20/2020 17:10    Procedures Procedures   Medications Ordered in ED Medications  potassium chloride SA (KLOR-CON) CR tablet 20 mEq (20 mEq Oral Given 11/20/20 1805)    ED Course  I have reviewed the triage vital signs and the nursing notes.  Pertinent labs & imaging results that were available during my care of the patient were reviewed by me and considered in my medical decision making (see chart for details).    MDM Rules/Calculators/A&P                           87 year old female with medical history above to include dementia presenting to the emergency department with mild abdominal discomfort, 1 episode of loose stools and concern for hypertension.  On arrival, the patient currently has asymptomatic hypertension.  Vitals significant for afebrile, not tachycardic or tachypneic, BP 190/91.   Physical exam significant for a completely benign abdomen.  No significant tenderness to light or deep palpation, no rebound or guarding.  Lungs were clear to auscultation.  Patient is at her mental baseline for her dementia and is  tolerating oral intake.  1 episode of loose stools today, no hematochezia or melena, appears well-hydrated on exam.  Screening labs undertaken to include a normal hepatic function panel, negative lipase, BMP with mild hypokalemia to 3.4, replenished orally, creatinine appears at baseline at 1.09.  CBC without a leukocytosis anemia or platelet abnormality.  EKG performed without ischemic changes.  X-ray of the abdomen and chest without acute abnormalities.  Cholelithiasis was noted on the patient's x-ray of the abdomen but she has no right upper quadrant tenderness and no evidence of biliary dysfunction on her screening labs.  Low concern for cholecystitis at this time.  Symptomatic cholelithiasis is possible although the patient is currently without complaint or pain or tenderness.  Very low concern for small bowel obstruction or other acute intra-abdominal emergency.  As the patient currently has asymptomatic hypertension, I do not feel that emergent lowering of her blood pressure is warranted at this time.  Recommended continued follow-up outpatient with her PCP.  Safe for discharge home.  The patient has been appropriately medically screened and/or stabilized in the ED. I have low suspicion for any other emergent medical condition which would require further screening, evaluation or treatment in the ED or require inpatient management.  Final Clinical Impression(s) / ED Diagnoses Final diagnoses:  Hypertension, unspecified type  Abdominal discomfort  Loose stools    Rx / DC Orders ED Discharge Orders     None        Regan Lemming, MD 11/21/20 1430

## 2020-11-20 NOTE — Discharge Instructions (Addendum)
Recommend scheduled follow-up with your PCP to to discuss your blood pressure regimen and for BP recheck in clinic.  Your abdominal exam was reassuring today with no tenderness to palpation.  Your x-ray imaging was negative and your EKG and laboratory work-up was also reassuring.

## 2020-11-28 DIAGNOSIS — G301 Alzheimer's disease with late onset: Secondary | ICD-10-CM | POA: Diagnosis not present

## 2020-11-28 DIAGNOSIS — I129 Hypertensive chronic kidney disease with stage 1 through stage 4 chronic kidney disease, or unspecified chronic kidney disease: Secondary | ICD-10-CM | POA: Diagnosis not present

## 2020-11-28 DIAGNOSIS — N1831 Chronic kidney disease, stage 3a: Secondary | ICD-10-CM | POA: Diagnosis not present

## 2020-12-26 DIAGNOSIS — N1831 Chronic kidney disease, stage 3a: Secondary | ICD-10-CM | POA: Diagnosis not present

## 2020-12-26 DIAGNOSIS — I129 Hypertensive chronic kidney disease with stage 1 through stage 4 chronic kidney disease, or unspecified chronic kidney disease: Secondary | ICD-10-CM | POA: Diagnosis not present

## 2020-12-26 DIAGNOSIS — G301 Alzheimer's disease with late onset: Secondary | ICD-10-CM | POA: Diagnosis not present

## 2021-01-29 DIAGNOSIS — G301 Alzheimer's disease with late onset: Secondary | ICD-10-CM | POA: Diagnosis not present

## 2021-01-29 DIAGNOSIS — N1831 Chronic kidney disease, stage 3a: Secondary | ICD-10-CM | POA: Diagnosis not present

## 2021-01-29 DIAGNOSIS — I129 Hypertensive chronic kidney disease with stage 1 through stage 4 chronic kidney disease, or unspecified chronic kidney disease: Secondary | ICD-10-CM | POA: Diagnosis not present

## 2021-02-17 DIAGNOSIS — R451 Restlessness and agitation: Secondary | ICD-10-CM | POA: Diagnosis not present

## 2021-02-25 DIAGNOSIS — I129 Hypertensive chronic kidney disease with stage 1 through stage 4 chronic kidney disease, or unspecified chronic kidney disease: Secondary | ICD-10-CM | POA: Diagnosis not present

## 2021-02-25 DIAGNOSIS — G301 Alzheimer's disease with late onset: Secondary | ICD-10-CM | POA: Diagnosis not present

## 2021-02-25 DIAGNOSIS — N1831 Chronic kidney disease, stage 3a: Secondary | ICD-10-CM | POA: Diagnosis not present

## 2021-02-25 DIAGNOSIS — I10 Essential (primary) hypertension: Secondary | ICD-10-CM | POA: Diagnosis not present

## 2021-02-25 DIAGNOSIS — G309 Alzheimer's disease, unspecified: Secondary | ICD-10-CM | POA: Diagnosis not present

## 2021-05-05 ENCOUNTER — Other Ambulatory Visit: Payer: Self-pay

## 2021-05-05 ENCOUNTER — Emergency Department (HOSPITAL_COMMUNITY)
Admission: EM | Admit: 2021-05-05 | Discharge: 2021-05-05 | Disposition: A | Discharge status: Home / Self Care | Payer: Medicare Other | Attending: Emergency Medicine | Admitting: Emergency Medicine

## 2021-05-05 ENCOUNTER — Emergency Department (HOSPITAL_COMMUNITY): Payer: Medicare Other

## 2021-05-05 DIAGNOSIS — W19XXXA Unspecified fall, initial encounter: Secondary | ICD-10-CM | POA: Diagnosis not present

## 2021-05-05 DIAGNOSIS — Y92129 Unspecified place in nursing home as the place of occurrence of the external cause: Secondary | ICD-10-CM | POA: Insufficient documentation

## 2021-05-05 DIAGNOSIS — S199XXA Unspecified injury of neck, initial encounter: Secondary | ICD-10-CM | POA: Diagnosis not present

## 2021-05-05 DIAGNOSIS — F0390 Unspecified dementia without behavioral disturbance: Secondary | ICD-10-CM | POA: Diagnosis not present

## 2021-05-05 DIAGNOSIS — I6381 Other cerebral infarction due to occlusion or stenosis of small artery: Secondary | ICD-10-CM | POA: Diagnosis not present

## 2021-05-05 DIAGNOSIS — S0003XA Contusion of scalp, initial encounter: Secondary | ICD-10-CM | POA: Insufficient documentation

## 2021-05-05 DIAGNOSIS — W01198A Fall on same level from slipping, tripping and stumbling with subsequent striking against other object, initial encounter: Secondary | ICD-10-CM | POA: Insufficient documentation

## 2021-05-05 DIAGNOSIS — Z7401 Bed confinement status: Secondary | ICD-10-CM | POA: Diagnosis not present

## 2021-05-05 DIAGNOSIS — R6889 Other general symptoms and signs: Secondary | ICD-10-CM | POA: Diagnosis not present

## 2021-05-05 DIAGNOSIS — Z743 Need for continuous supervision: Secondary | ICD-10-CM | POA: Diagnosis not present

## 2021-05-05 DIAGNOSIS — S0990XA Unspecified injury of head, initial encounter: Secondary | ICD-10-CM | POA: Diagnosis not present

## 2021-05-05 NOTE — ED Notes (Signed)
Pt ambulated with assistance to restroom with tech  ?

## 2021-05-05 NOTE — ED Provider Notes (Signed)
?[facility] ?Provider Note ? ? ?CSN: [id number] ?Arrival date & time: 05/05/21  2426 ? ?  ? ?History ? ?Chief Complaint  ?Patient presents with  ? Fall  ?  Not on thinners   ? ? ?CORINN STOLTZFUS is a 88 y.o. female. ? ?Patient sent to the emergency department for evaluation after a fall.  Patient had a witnessed fall at nursing home.  She fell backwards and hit her head.  No loss of consciousness.  At arrival to the emergency department, patient denies any pain. ? ? ?  ? ?Home Medications ?Prior to Admission medications   ?Medication Sig Start Date End Date Taking? Authorizing Provider  ?amLODipine (NORVASC) 2.5 MG tablet Take 2.5 mg by mouth at bedtime.   Yes [provider]  ?busPIRone (BUSPAR) 15 MG tablet Take 1 tablet every morning ?Patient taking differently: Take 15 mg by mouth 2 (two) times daily. 12 pm and 8 pm 04/04/19  Yes Cameron Sprang, MD  ?escitalopram (LEXAPRO) 10 MG tablet TK 1 T PO QD 05/29/17   [provider]  ?ketoconazole (NIZORAL) 2 % cream Apply 1 application topically at bedtime. 07/30/20   Warren Danes, PA-C  ?LORazepam (ATIVAN) 1 MG tablet Take 1 mg by mouth 2 (two) times daily. 04/27/21   [provider]  ?metoprolol succinate (TOPROL-XL) 50 MG 24 hr tablet Take 50 mg by mouth every morning. Take with or immediately following a meal.    [provider]  ?ramipril (ALTACE) 10 MG tablet Take 10 mg by mouth every morning.     [provider]  ?silver sulfADIAZINE (SSD) 1 % cream Apply 1 application topically daily. 08/12/20   Warren Danes, PA-C  ?triamcinolone cream (KENALOG) 0.1 % Apply 1 application topically daily as needed. 08/19/20   Lavonna Monarch, MD  ?gabapentin (NEURONTIN) 100 MG capsule Take 100 mg by mouth 3 (three) times daily.  04/14/11  [provider]  ?   ? ?Allergies    ?Patient has no known allergies.   ? ?Review of Systems   ?Review of Systems ? ?Physical Exam ?Updated  Vital Signs ?BP (!) 182/88   Pulse 86   Temp 97.7 ?F (36.5 ?C) (Oral)   Resp (!) 21   Ht '5\' 2"'$  (1.575 m)   Wt 72.6 kg   SpO2 98%   BMI 29.26 kg/m?  ?Physical Exam ?Vitals and nursing note reviewed.  ?Constitutional:   ?   General: She is not in acute distress. ?   Appearance: She is well-developed.  ?HENT:  ?   Head: Normocephalic and atraumatic.  ?   Mouth/Throat:  ?   Mouth: Mucous membranes are moist.  ?Eyes:  ?   General: Vision grossly intact. Gaze aligned appropriately.  ?   Extraocular Movements: Extraocular movements intact.  ?   Conjunctiva/sclera: Conjunctivae normal.  ?Cardiovascular:  ?   Rate and Rhythm: Normal rate and regular rhythm.  ?   Pulses: Normal pulses.  ?   Heart sounds: Normal heart sounds, S1 normal and S2 normal. No murmur heard. ?  No friction rub. No gallop.  ?Pulmonary:  ?   Effort: Pulmonary effort is normal. No respiratory distress.  ?   Breath sounds: Normal breath sounds.  ?Abdominal:  ?   General: Bowel sounds are normal.  ?   Palpations: Abdomen is soft.  ?   Tenderness: There is no abdominal tenderness. There is no guarding or rebound.  ?  Hernia: No hernia is present.  ?Musculoskeletal:     ?   General: No swelling.  ?   Cervical back: Full passive range of motion without pain, normal range of motion and neck supple. No spinous process tenderness or muscular tenderness. Normal range of motion.  ?   Right hip: Normal.  ?   Left hip: Normal.  ?   Right lower leg: No edema.  ?   Left lower leg: No edema.  ?Skin: ?   General: Skin is warm and dry.  ?   Capillary Refill: Capillary refill takes less than 2 seconds.  ?   Findings: No ecchymosis, erythema, rash or wound.  ?Neurological:  ?   General: No focal deficit present.  ?   Mental Status: She is alert and oriented to person, place, and time.  ?   GCS: GCS eye subscore is 4. GCS verbal subscore is 5. GCS motor subscore is 6.  ?   Cranial Nerves: Cranial nerves 2-12 are intact.  ?   Sensory: Sensation is intact.  ?   Motor:  Motor function is intact.  ?   Coordination: Coordination is intact.  ?Psychiatric:     ?   Attention and Perception: Attention normal.     ?   Mood and Affect: Mood normal.     ?   Speech: Speech normal.     ?   Behavior: Behavior normal.  ? ? ?ED Results / Procedures / Treatments   ?Labs ?(all labs ordered are listed, but only abnormal results are displayed) ?Labs Reviewed - No data to display ? ?EKG ?None ? ?Radiology ?CT HEAD WO CONTRAST (5MM) ? ?Result Date: 05/05/2021 ?CLINICAL DATA:  88 year old female with history of trauma after falling backwards and striking her head. EXAM: CT HEAD WITHOUT CONTRAST CT CERVICAL SPINE WITHOUT CONTRAST TECHNIQUE: Multidetector CT imaging of the head and cervical spine was performed following the standard protocol without intravenous contrast. Multiplanar CT image reconstructions of the cervical spine were also generated. RADIATION DOSE REDUCTION: This exam was performed according to the departmental dose-optimization program which includes automated exposure control, adjustment of the mA and/or kV according to patient size and/or use of iterative reconstruction technique. COMPARISON:  No priors. FINDINGS: CT HEAD FINDINGS Brain: Mild cerebral and cerebellar atrophy. Patchy and confluent areas of decreased attenuation are noted throughout the deep and periventricular white matter of the cerebral hemispheres bilaterally, compatible with chronic microvascular ischemic disease. Tiny foci of low attenuation are noted in the basal ganglia bilaterally, compatible with old lacunar infarcts. No evidence of acute infarction, hemorrhage, hydrocephalus, extra-axial collection or mass lesion/mass effect. Vascular: No hyperdense vessel or unexpected calcification. Skull: Normal. Negative for fracture or focal lesion. Sinuses/Orbits: No acute finding. Other: None. CT CERVICAL SPINE FINDINGS Alignment: Normal. Skull base and vertebrae: No acute fracture. No primary bone lesion or focal  pathologic process. Soft tissues and spinal canal: No prevertebral fluid or swelling. No visible canal hematoma. Disc levels: Multilevel degenerative disc disease, most pronounced at C5-C6 and C6-C7. Severe multilevel facet arthropathy bilaterally. Upper chest: Unremarkable. Other: Dystrophic ossification noted in the paraspinal soft tissues posteriorly, presumably sequela of remote trauma. IMPRESSION: 1. No evidence of significant acute traumatic injury to the skull, brain or cervical spine. 2. Mild cerebral and cerebellar atrophy with extensive chronic microvascular ischemic changes in the cerebral white matter and old bilateral basal ganglia lacunar infarcts, as above. 3. Multilevel degenerative disc disease and cervical spondylosis, as above. Electronically Signed   By: Quillian Quince  Entrikin M.D.   On: 05/05/2021 06:13  ? ?CT CERVICAL SPINE WO CONTRAST ? ?Result Date: 05/05/2021 ?CLINICAL DATA:  88 year old female with history of trauma after falling backwards and striking her head. EXAM: CT HEAD WITHOUT CONTRAST CT CERVICAL SPINE WITHOUT CONTRAST TECHNIQUE: Multidetector CT imaging of the head and cervical spine was performed following the standard protocol without intravenous contrast. Multiplanar CT image reconstructions of the cervical spine were also generated. RADIATION DOSE REDUCTION: This exam was performed according to the departmental dose-optimization program which includes automated exposure control, adjustment of the mA and/or kV according to patient size and/or use of iterative reconstruction technique. COMPARISON:  No priors. FINDINGS: CT HEAD FINDINGS Brain: Mild cerebral and cerebellar atrophy. Patchy and confluent areas of decreased attenuation are noted throughout the deep and periventricular white matter of the cerebral hemispheres bilaterally, compatible with chronic microvascular ischemic disease. Tiny foci of low attenuation are noted in the basal ganglia bilaterally, compatible with old lacunar  infarcts. No evidence of acute infarction, hemorrhage, hydrocephalus, extra-axial collection or mass lesion/mass effect. Vascular: No hyperdense vessel or unexpected calcification. Skull: Normal. Negative for fractu

## 2021-05-05 NOTE — ED Notes (Signed)
[facility] to be called for pt  ?

## 2021-05-05 NOTE — ED Notes (Signed)
[facility] here to get pt  

## 2021-05-05 NOTE — ED Triage Notes (Signed)
Pt BIB EMS from [facility]. Per staff pt got out of bed and was trying to walk out of the door when she fell backwards and hit her head. No reported LOC, not on thinners. Pt has hx of dementia.  ?EMS reports no obvious injuries.  ?VSS with EMS  ?

## 2021-06-22 DIAGNOSIS — N1832 Chronic kidney disease, stage 3b: Secondary | ICD-10-CM | POA: Diagnosis not present

## 2021-06-22 DIAGNOSIS — I10 Essential (primary) hypertension: Secondary | ICD-10-CM | POA: Diagnosis not present

## 2021-06-26 DIAGNOSIS — M6281 Muscle weakness (generalized): Secondary | ICD-10-CM | POA: Diagnosis not present

## 2021-06-26 DIAGNOSIS — R296 Repeated falls: Secondary | ICD-10-CM | POA: Diagnosis not present

## 2021-06-26 DIAGNOSIS — R2681 Unsteadiness on feet: Secondary | ICD-10-CM | POA: Diagnosis not present

## 2021-07-01 DIAGNOSIS — M6281 Muscle weakness (generalized): Secondary | ICD-10-CM | POA: Diagnosis not present

## 2021-07-01 DIAGNOSIS — R296 Repeated falls: Secondary | ICD-10-CM | POA: Diagnosis not present

## 2021-07-01 DIAGNOSIS — R2681 Unsteadiness on feet: Secondary | ICD-10-CM | POA: Diagnosis not present

## 2021-07-03 DIAGNOSIS — M6281 Muscle weakness (generalized): Secondary | ICD-10-CM | POA: Diagnosis not present

## 2021-07-03 DIAGNOSIS — R296 Repeated falls: Secondary | ICD-10-CM | POA: Diagnosis not present

## 2021-07-03 DIAGNOSIS — R2681 Unsteadiness on feet: Secondary | ICD-10-CM | POA: Diagnosis not present

## 2021-07-06 DIAGNOSIS — M6281 Muscle weakness (generalized): Secondary | ICD-10-CM | POA: Diagnosis not present

## 2021-07-06 DIAGNOSIS — R296 Repeated falls: Secondary | ICD-10-CM | POA: Diagnosis not present

## 2021-07-06 DIAGNOSIS — R2681 Unsteadiness on feet: Secondary | ICD-10-CM | POA: Diagnosis not present

## 2021-07-13 DIAGNOSIS — B379 Candidiasis, unspecified: Secondary | ICD-10-CM | POA: Diagnosis not present

## 2021-07-15 DIAGNOSIS — R2681 Unsteadiness on feet: Secondary | ICD-10-CM | POA: Diagnosis not present

## 2021-07-15 DIAGNOSIS — R296 Repeated falls: Secondary | ICD-10-CM | POA: Diagnosis not present

## 2021-07-15 DIAGNOSIS — M6281 Muscle weakness (generalized): Secondary | ICD-10-CM | POA: Diagnosis not present

## 2021-07-16 DIAGNOSIS — R296 Repeated falls: Secondary | ICD-10-CM | POA: Diagnosis not present

## 2021-07-16 DIAGNOSIS — R2681 Unsteadiness on feet: Secondary | ICD-10-CM | POA: Diagnosis not present

## 2021-07-16 DIAGNOSIS — M6281 Muscle weakness (generalized): Secondary | ICD-10-CM | POA: Diagnosis not present

## 2021-07-20 DIAGNOSIS — M6281 Muscle weakness (generalized): Secondary | ICD-10-CM | POA: Diagnosis not present

## 2021-07-20 DIAGNOSIS — R296 Repeated falls: Secondary | ICD-10-CM | POA: Diagnosis not present

## 2021-07-20 DIAGNOSIS — R2681 Unsteadiness on feet: Secondary | ICD-10-CM | POA: Diagnosis not present

## 2021-07-23 DIAGNOSIS — M6281 Muscle weakness (generalized): Secondary | ICD-10-CM | POA: Diagnosis not present

## 2021-07-23 DIAGNOSIS — R296 Repeated falls: Secondary | ICD-10-CM | POA: Diagnosis not present

## 2021-07-23 DIAGNOSIS — R2681 Unsteadiness on feet: Secondary | ICD-10-CM | POA: Diagnosis not present

## 2021-07-27 DIAGNOSIS — R2681 Unsteadiness on feet: Secondary | ICD-10-CM | POA: Diagnosis not present

## 2021-07-27 DIAGNOSIS — R296 Repeated falls: Secondary | ICD-10-CM | POA: Diagnosis not present

## 2021-07-27 DIAGNOSIS — M6281 Muscle weakness (generalized): Secondary | ICD-10-CM | POA: Diagnosis not present

## 2021-07-29 DIAGNOSIS — M6281 Muscle weakness (generalized): Secondary | ICD-10-CM | POA: Diagnosis not present

## 2021-07-29 DIAGNOSIS — R296 Repeated falls: Secondary | ICD-10-CM | POA: Diagnosis not present

## 2021-07-29 DIAGNOSIS — R2681 Unsteadiness on feet: Secondary | ICD-10-CM | POA: Diagnosis not present

## 2021-07-31 DIAGNOSIS — R296 Repeated falls: Secondary | ICD-10-CM | POA: Diagnosis not present

## 2021-07-31 DIAGNOSIS — M6281 Muscle weakness (generalized): Secondary | ICD-10-CM | POA: Diagnosis not present

## 2021-07-31 DIAGNOSIS — R2681 Unsteadiness on feet: Secondary | ICD-10-CM | POA: Diagnosis not present

## 2021-08-03 DIAGNOSIS — R296 Repeated falls: Secondary | ICD-10-CM | POA: Diagnosis not present

## 2021-08-03 DIAGNOSIS — I10 Essential (primary) hypertension: Secondary | ICD-10-CM | POA: Diagnosis not present

## 2021-08-03 DIAGNOSIS — N1832 Chronic kidney disease, stage 3b: Secondary | ICD-10-CM | POA: Diagnosis not present

## 2021-08-03 DIAGNOSIS — R2681 Unsteadiness on feet: Secondary | ICD-10-CM | POA: Diagnosis not present

## 2021-08-03 DIAGNOSIS — M6281 Muscle weakness (generalized): Secondary | ICD-10-CM | POA: Diagnosis not present

## 2021-08-06 DIAGNOSIS — E119 Type 2 diabetes mellitus without complications: Secondary | ICD-10-CM | POA: Diagnosis not present

## 2021-08-06 DIAGNOSIS — D519 Vitamin B12 deficiency anemia, unspecified: Secondary | ICD-10-CM | POA: Diagnosis not present

## 2021-08-06 DIAGNOSIS — I10 Essential (primary) hypertension: Secondary | ICD-10-CM | POA: Diagnosis not present

## 2021-08-06 DIAGNOSIS — E039 Hypothyroidism, unspecified: Secondary | ICD-10-CM | POA: Diagnosis not present

## 2021-08-06 DIAGNOSIS — E559 Vitamin D deficiency, unspecified: Secondary | ICD-10-CM | POA: Diagnosis not present

## 2021-08-11 DIAGNOSIS — M6281 Muscle weakness (generalized): Secondary | ICD-10-CM | POA: Diagnosis not present

## 2021-08-11 DIAGNOSIS — R296 Repeated falls: Secondary | ICD-10-CM | POA: Diagnosis not present

## 2021-08-11 DIAGNOSIS — R2681 Unsteadiness on feet: Secondary | ICD-10-CM | POA: Diagnosis not present

## 2021-08-12 DIAGNOSIS — R2681 Unsteadiness on feet: Secondary | ICD-10-CM | POA: Diagnosis not present

## 2021-08-12 DIAGNOSIS — R296 Repeated falls: Secondary | ICD-10-CM | POA: Diagnosis not present

## 2021-08-12 DIAGNOSIS — G309 Alzheimer's disease, unspecified: Secondary | ICD-10-CM | POA: Diagnosis not present

## 2021-08-12 DIAGNOSIS — I10 Essential (primary) hypertension: Secondary | ICD-10-CM | POA: Diagnosis not present

## 2021-08-12 DIAGNOSIS — M6281 Muscle weakness (generalized): Secondary | ICD-10-CM | POA: Diagnosis not present

## 2021-08-12 DIAGNOSIS — N1832 Chronic kidney disease, stage 3b: Secondary | ICD-10-CM | POA: Diagnosis not present

## 2021-08-13 ENCOUNTER — Ambulatory Visit: Payer: Medicare Other | Admitting: Podiatry

## 2021-08-13 DIAGNOSIS — B351 Tinea unguium: Secondary | ICD-10-CM

## 2021-08-13 DIAGNOSIS — M79674 Pain in right toe(s): Secondary | ICD-10-CM

## 2021-08-13 DIAGNOSIS — M79675 Pain in left toe(s): Secondary | ICD-10-CM | POA: Diagnosis not present

## 2021-08-15 ENCOUNTER — Encounter: Payer: Self-pay | Admitting: Podiatry

## 2021-08-15 NOTE — Progress Notes (Signed)
  Subjective:  Patient ID: Tasha Tran, female    DOB: 03/07/1929,  MRN: [id number]  Painful thickened nails that they are unable to cut  88 y.o. female presents with the above complaint. History confirmed with patient.  She is here with her daughter who confirms the history, her mother has memory issues  Objective:  Physical Exam: warm, good capillary refill, no trophic changes or ulcerative lesions, normal DP and PT pulses, and normal sensory exam. Left Foot: dystrophic yellowed discolored nail plates with subungual debris Right Foot: dystrophic yellowed discolored nail plates with subungual debris  Assessment:   1. Pain due to onychomycosis of toenails of both feet      Plan:  Patient was evaluated and treated and all questions answered.  Discussed the etiology and treatment options for the condition in detail with the patient. Educated patient on the topical and oral treatment options for mycotic nails. Recommended debridement of the nails today. Sharp and mechanical debridement performed of all painful and mycotic nails today. Nails debrided in length and thickness using a nail nipper to level of comfort. Discussed treatment options including appropriate shoe gear. Follow up as needed for painful nails.    Return in about 3 months (around 11/13/2021) for thick painful nails.

## 2021-08-18 DIAGNOSIS — R2681 Unsteadiness on feet: Secondary | ICD-10-CM | POA: Diagnosis not present

## 2021-08-18 DIAGNOSIS — R296 Repeated falls: Secondary | ICD-10-CM | POA: Diagnosis not present

## 2021-08-18 DIAGNOSIS — M6281 Muscle weakness (generalized): Secondary | ICD-10-CM | POA: Diagnosis not present

## 2021-09-14 DIAGNOSIS — M6281 Muscle weakness (generalized): Secondary | ICD-10-CM | POA: Diagnosis not present

## 2021-09-14 DIAGNOSIS — N1832 Chronic kidney disease, stage 3b: Secondary | ICD-10-CM | POA: Diagnosis not present

## 2021-09-14 DIAGNOSIS — I10 Essential (primary) hypertension: Secondary | ICD-10-CM | POA: Diagnosis not present

## 2021-09-25 DIAGNOSIS — I10 Essential (primary) hypertension: Secondary | ICD-10-CM | POA: Diagnosis not present

## 2021-09-25 DIAGNOSIS — G309 Alzheimer's disease, unspecified: Secondary | ICD-10-CM | POA: Diagnosis not present

## 2021-09-29 DIAGNOSIS — R410 Disorientation, unspecified: Secondary | ICD-10-CM | POA: Diagnosis not present

## 2021-09-29 DIAGNOSIS — R451 Restlessness and agitation: Secondary | ICD-10-CM | POA: Diagnosis not present

## 2021-10-05 DIAGNOSIS — B3749 Other urogenital candidiasis: Secondary | ICD-10-CM | POA: Diagnosis not present

## 2021-10-12 DIAGNOSIS — N1832 Chronic kidney disease, stage 3b: Secondary | ICD-10-CM | POA: Diagnosis not present

## 2021-10-12 DIAGNOSIS — I10 Essential (primary) hypertension: Secondary | ICD-10-CM | POA: Diagnosis not present

## 2021-10-12 DIAGNOSIS — B3749 Other urogenital candidiasis: Secondary | ICD-10-CM | POA: Diagnosis not present

## 2021-10-16 DIAGNOSIS — G309 Alzheimer's disease, unspecified: Secondary | ICD-10-CM | POA: Diagnosis not present

## 2021-10-16 DIAGNOSIS — I10 Essential (primary) hypertension: Secondary | ICD-10-CM | POA: Diagnosis not present

## 2021-11-05 DIAGNOSIS — I10 Essential (primary) hypertension: Secondary | ICD-10-CM | POA: Diagnosis not present

## 2021-11-05 DIAGNOSIS — G309 Alzheimer's disease, unspecified: Secondary | ICD-10-CM | POA: Diagnosis not present

## 2021-11-05 DIAGNOSIS — R5381 Other malaise: Secondary | ICD-10-CM | POA: Diagnosis not present

## 2021-11-06 DIAGNOSIS — N390 Urinary tract infection, site not specified: Secondary | ICD-10-CM | POA: Diagnosis not present

## 2021-11-16 DIAGNOSIS — N390 Urinary tract infection, site not specified: Secondary | ICD-10-CM | POA: Diagnosis not present

## 2021-11-17 ENCOUNTER — Ambulatory Visit: Payer: Medicare Other | Admitting: Podiatry

## 2021-11-24 DIAGNOSIS — G309 Alzheimer's disease, unspecified: Secondary | ICD-10-CM | POA: Diagnosis not present

## 2021-11-24 DIAGNOSIS — I10 Essential (primary) hypertension: Secondary | ICD-10-CM | POA: Diagnosis not present

## 2021-12-07 DIAGNOSIS — I10 Essential (primary) hypertension: Secondary | ICD-10-CM | POA: Diagnosis not present

## 2021-12-07 DIAGNOSIS — M6281 Muscle weakness (generalized): Secondary | ICD-10-CM | POA: Diagnosis not present

## 2021-12-16 DIAGNOSIS — M6281 Muscle weakness (generalized): Secondary | ICD-10-CM | POA: Diagnosis not present

## 2021-12-16 DIAGNOSIS — N1832 Chronic kidney disease, stage 3b: Secondary | ICD-10-CM | POA: Diagnosis not present

## 2021-12-16 DIAGNOSIS — I10 Essential (primary) hypertension: Secondary | ICD-10-CM | POA: Diagnosis not present

## 2021-12-28 DIAGNOSIS — G309 Alzheimer's disease, unspecified: Secondary | ICD-10-CM | POA: Diagnosis not present

## 2021-12-28 DIAGNOSIS — I10 Essential (primary) hypertension: Secondary | ICD-10-CM | POA: Diagnosis not present

## 2022-01-07 DIAGNOSIS — I10 Essential (primary) hypertension: Secondary | ICD-10-CM | POA: Diagnosis not present

## 2022-01-07 DIAGNOSIS — M6281 Muscle weakness (generalized): Secondary | ICD-10-CM | POA: Diagnosis not present

## 2022-01-07 DIAGNOSIS — G309 Alzheimer's disease, unspecified: Secondary | ICD-10-CM | POA: Diagnosis not present

## 2022-01-21 DIAGNOSIS — I10 Essential (primary) hypertension: Secondary | ICD-10-CM | POA: Diagnosis not present

## 2022-01-21 DIAGNOSIS — G309 Alzheimer's disease, unspecified: Secondary | ICD-10-CM | POA: Diagnosis not present

## 2022-01-29 DIAGNOSIS — N390 Urinary tract infection, site not specified: Secondary | ICD-10-CM | POA: Diagnosis not present

## 2022-03-05 ENCOUNTER — Other Ambulatory Visit: Payer: Self-pay

## 2022-03-05 ENCOUNTER — Encounter (HOSPITAL_COMMUNITY): Payer: Self-pay

## 2022-03-05 ENCOUNTER — Emergency Department (HOSPITAL_COMMUNITY): Payer: Medicare Other

## 2022-03-05 ENCOUNTER — Emergency Department (HOSPITAL_COMMUNITY)
Admission: EM | Admit: 2022-03-05 | Discharge: 2022-03-05 | Disposition: A | Discharge status: Skilled Nursing Facility | Payer: Medicare Other | Attending: Emergency Medicine | Admitting: Emergency Medicine

## 2022-03-05 DIAGNOSIS — R451 Restlessness and agitation: Secondary | ICD-10-CM | POA: Insufficient documentation

## 2022-03-05 DIAGNOSIS — U071 COVID-19: Secondary | ICD-10-CM

## 2022-03-05 DIAGNOSIS — R4182 Altered mental status, unspecified: Secondary | ICD-10-CM | POA: Diagnosis present

## 2022-03-05 LAB — URINALYSIS, ROUTINE W REFLEX MICROSCOPIC
Bilirubin Urine: NEGATIVE
Glucose, UA: NEGATIVE mg/dL
Hgb urine dipstick: NEGATIVE
Ketones, ur: NEGATIVE mg/dL
Leukocytes,Ua: NEGATIVE
Nitrite: NEGATIVE
Protein, ur: 30 mg/dL — AB
Specific Gravity, Urine: 1.019 (ref 1.005–1.030)
pH: 5 (ref 5.0–8.0)

## 2022-03-05 LAB — COMPREHENSIVE METABOLIC PANEL
ALT: 11 U/L (ref 0–44)
AST: 18 U/L (ref 15–41)
Albumin: 3.5 g/dL (ref 3.5–5.0)
Alkaline Phosphatase: 64 U/L (ref 38–126)
Anion gap: 11 (ref 5–15)
BUN: 46 mg/dL — ABNORMAL HIGH (ref 8–23)
CO2: 21 mmol/L — ABNORMAL LOW (ref 22–32)
Calcium: 8.9 mg/dL (ref 8.9–10.3)
Chloride: 107 mmol/L (ref 98–111)
Creatinine, Ser: 1.68 mg/dL — ABNORMAL HIGH (ref 0.44–1.00)
GFR, Estimated: 29 mL/min — ABNORMAL LOW (ref >60)
Glucose, Bld: 122 mg/dL — ABNORMAL HIGH (ref 70–99)
Potassium: 3.6 mmol/L (ref 3.5–5.1)
Sodium: 139 mmol/L (ref 135–145)
Total Bilirubin: 0.3 mg/dL (ref 0.3–1.2)
Total Protein: 7 g/dL (ref 6.5–8.1)

## 2022-03-05 LAB — CBC WITH DIFFERENTIAL/PLATELET
Abs Immature Granulocytes: 0.24 10*3/uL — ABNORMAL HIGH (ref 0.00–0.07)
Basophils Absolute: 0 10*3/uL (ref 0.0–0.1)
Basophils Relative: 0 %
Eosinophils Absolute: 0 10*3/uL (ref 0.0–0.5)
Eosinophils Relative: 0 %
HCT: 43.4 % (ref 36.0–46.0)
Hemoglobin: 14 g/dL (ref 12.0–15.0)
Immature Granulocytes: 2 %
Lymphocytes Relative: 6 %
Lymphs Abs: 0.6 10*3/uL — ABNORMAL LOW (ref 0.7–4.0)
MCH: 30.6 pg (ref 26.0–34.0)
MCHC: 32.3 g/dL (ref 30.0–36.0)
MCV: 95 fL (ref 80.0–100.0)
Monocytes Absolute: 0.6 10*3/uL (ref 0.1–1.0)
Monocytes Relative: 6 %
Neutro Abs: 8.5 10*3/uL — ABNORMAL HIGH (ref 1.7–7.7)
Neutrophils Relative %: 86 %
Platelets: 203 10*3/uL (ref 150–400)
RBC: 4.57 MIL/uL (ref 3.87–5.11)
RDW: 13.5 % (ref 11.5–15.5)
WBC: 10 10*3/uL (ref 4.0–10.5)
nRBC: 0 % (ref 0.0–0.2)

## 2022-03-05 LAB — RESP PANEL BY RT-PCR (RSV, FLU A&B, COVID)  RVPGX2
Influenza A by PCR: NEGATIVE
Influenza B by PCR: NEGATIVE
Resp Syncytial Virus by PCR: NEGATIVE
SARS Coronavirus 2 by RT PCR: POSITIVE — AB

## 2022-03-05 MED ORDER — LORAZEPAM 1 MG PO TABS
1.0000 mg | ORAL_TABLET | Freq: Once | ORAL | Status: AC
  Administered 2022-03-05: 1 mg via ORAL
  Filled 2022-03-05: qty 1

## 2022-03-05 NOTE — ED Notes (Signed)
Urine collected and sent to lab.

## 2022-03-05 NOTE — ED Provider Notes (Signed)
[facility] Provider Note   CSN: [id number] Arrival date & time: 03/05/22  1119     History  Chief Complaint  Patient presents with   Altered Mental Status    [facility] Facility states today patient has been uncooperative and combative. Last time this happened pt had a UTI. CBG 154. vss    LEEAN Tran is a 89 y.o. female.  89 yo F with a chief complaints of acute agitation.  This been going on throughout the evening.  I had spoken with the patient's relative who told me that she had recently been diagnosed with COVID.  The facility was having some trouble with her agitation overnight.  Level 5 caveat altered mental status.   Altered Mental Status      Home Medications Prior to Admission medications   Medication Sig Start Date End Date Taking? Authorizing Provider  amLODipine (NORVASC) 2.5 MG tablet Take 2.5 mg by mouth at bedtime.    [provider]  busPIRone (BUSPAR) 15 MG tablet Take 1 tablet every morning Patient taking differently: Take 15 mg by mouth 2 (two) times daily. 12 pm and 8 pm 04/04/19   Cameron Sprang, MD  ketoconazole (NIZORAL) 2 % cream Apply 1 application topically at bedtime. Patient not taking: Reported on 05/05/2021 07/30/20   Robyne Askew R, PA-C  LORazepam (ATIVAN) 0.5 MG tablet Take 0.5 mg by mouth every 8 (eight) hours as needed for anxiety.    [provider]  LORazepam (ATIVAN) 1 MG tablet Take 1 mg by mouth 2 (two) times daily. 04/27/21   [provider]  metoprolol succinate (TOPROL-XL) 25 MG 24 hr tablet Take 12.5 mg by mouth daily at 12 noon. Take with or immediately following a meal.    [provider]  silver sulfADIAZINE (SSD) 1 % cream Apply 1 application topically daily. Patient not taking: Reported on 05/05/2021 08/12/20   Robyne Askew R, PA-C  triamcinolone cream (KENALOG) 0.1 % Apply 1 application topically daily as needed. Patient not  taking: Reported on 05/05/2021 08/19/20   Lavonna Monarch, MD  gabapentin (NEURONTIN) 100 MG capsule Take 100 mg by mouth 3 (three) times daily.  04/14/11  [provider]      Allergies    Patient has no known allergies.    Review of Systems   Review of Systems  Physical Exam Updated Vital Signs BP (!) 147/89   Pulse 87   Temp 98.1 F (36.7 C) (Oral)   Resp 18   Ht '5\' 2"'$  (1.575 m)   Wt 72.6 kg   SpO2 97%   BMI 29.27 kg/m  Physical Exam Vitals and nursing note reviewed.  Constitutional:      General: She is not in acute distress.    Appearance: She is well-developed. She is not diaphoretic.  HENT:     Head: Normocephalic and atraumatic.  Eyes:     Pupils: Pupils are equal, round, and reactive to light.  Cardiovascular:     Rate and Rhythm: Normal rate and regular rhythm.     Heart sounds: No murmur heard.    No friction rub. No gallop.  Pulmonary:     Effort: Pulmonary effort is normal.     Breath sounds: No wheezing or rales.  Abdominal:     General: There is no distension.     Palpations: Abdomen is soft.     Tenderness: There is no abdominal tenderness.  Musculoskeletal:  General: No tenderness.     Cervical back: Normal range of motion and neck supple.  Skin:    General: Skin is warm and dry.  Neurological:     Mental Status: She is alert and oriented to person, place, and time.  Psychiatric:        Behavior: Behavior normal.     ED Results / Procedures / Treatments   Labs (all labs ordered are listed, but only abnormal results are displayed) Labs Reviewed  RESP PANEL BY RT-PCR (RSV, FLU A&B, COVID)  RVPGX2 - Abnormal; Notable for the following components:      Result Value   SARS Coronavirus 2 by RT PCR POSITIVE (*)    All other components within normal limits  CBC WITH DIFFERENTIAL/PLATELET - Abnormal; Notable for the following components:   Neutro Abs 8.5 (*)    Lymphs Abs 0.6 (*)    Abs Immature Granulocytes 0.24 (*)    All other  components within normal limits  COMPREHENSIVE METABOLIC PANEL - Abnormal; Notable for the following components:   CO2 21 (*)    Glucose, Bld 122 (*)    BUN 46 (*)    Creatinine, Ser 1.68 (*)    GFR, Estimated 29 (*)    All other components within normal limits  URINALYSIS, ROUTINE W REFLEX MICROSCOPIC - Abnormal; Notable for the following components:   Protein, ur 30 (*)    Bacteria, UA MANY (*)    All other components within normal limits    EKG None  Radiology DG Chest Port 1 View  Result Date: 03/05/2022 CLINICAL DATA:  Altered mental status EXAM: PORTABLE CHEST 1 VIEW COMPARISON:  11/20/20 FINDINGS: no effusion. No pneumothorax. No focal airspace opacity. Unchanged enlarged cardiac contours. No radiographically apparent displaced rib fractures. Visualized upper abdomen is unremarkable. IMPRESSION: 1. No active disease. 2. Unchanged enlarged cardiac contours. Electronically Signed   By: Marin Roberts M.D.   On: 03/05/2022 12:22    Procedures Procedures    Medications Ordered in ED Medications - No data to display  ED Course/ Medical Decision Making/ A&P                             Medical Decision Making Amount and/or Complexity of Data Reviewed Labs: ordered. Radiology: ordered.   89 yo F with a chief complaints of altered mental status.  Further history was obtained by the patient's family member, told me that she had recently been diagnosed with COVID.  She is awake and alert.  No signs of aggression at the moment.  Will obtain a laboratory evaluation chest x-ray COVID test here.  Chest x-ray independently interpreted by me without focal infiltrate or pneumothorax.   COVID test here is positive.  She has a mild bump in her renal function.  I discussed the results with the family.  They would like her to go back to the facility if possible.  They plan to take her POV.  Will have them return for any worsening.  Follow-up with your family doctor in the office.  2:33  PM:  I have discussed the diagnosis/risks/treatment options with the patient.  Evaluation and diagnostic testing in the emergency department does not suggest an emergent condition requiring admission or immediate intervention beyond what has been performed at this time.  They will follow up with PCP. We also discussed returning to the ED immediately if new or worsening sx occur. We discussed the sx which  are most concerning (e.g., sudden worsening pain, fever, inability to tolerate by mouth) that necessitate immediate return. Medications administered to the patient during their visit and any new prescriptions provided to the patient are listed below.  Medications given during this visit Medications - No data to display   The patient appears reasonably screen and/or stabilized for discharge and I doubt any other medical condition or other [facility] requiring further screening, evaluation, or treatment in the ED at this time prior to discharge.          Final Clinical Impression(s) / ED Diagnoses Final diagnoses:  COVID-19 virus infection    Rx / DC Orders ED Discharge Orders     None         Deno Etienne, DO 03/05/22 1433

## 2022-03-05 NOTE — Discharge Instructions (Signed)
Eat and drink as well as you can.  Follow up with your family doc.  Return for worsening symptoms.

## 2022-03-05 NOTE — ED Notes (Signed)
Daughter at bedside. Pt on cm sp02 and nibp call bell in reach. Purewick placed on patient, daughter educated on need for urine sample.

## 2022-03-05 NOTE — ED Notes (Signed)
Attempting to dc patient. Pt daughter requesting patient have her home dose of ativan before leaving. Awaiting md floyd

## 2022-04-02 DEATH — deceased
